# Patient Record
Sex: Male | Born: 1975 | Race: Black or African American | Hispanic: No | Marital: Married | Smoking: Current every day smoker
Health system: Southern US, Community
[De-identification: ages and names within clinical notes are randomized; demographics above are authoritative.]

## PROBLEM LIST (undated history)

## (undated) DIAGNOSIS — I10 Essential (primary) hypertension: Secondary | ICD-10-CM

## (undated) DIAGNOSIS — E1140 Type 2 diabetes mellitus with diabetic neuropathy, unspecified: Secondary | ICD-10-CM

## (undated) HISTORY — PX: WISDOM TOOTH EXTRACTION: SHX21

---

## 2001-11-19 ENCOUNTER — Emergency Department (HOSPITAL_COMMUNITY): Admission: EM | Admit: 2001-11-19 | Discharge: 2001-11-20 | Payer: Self-pay | Admitting: Emergency Medicine

## 2001-11-20 ENCOUNTER — Encounter: Payer: Self-pay | Admitting: Emergency Medicine

## 2003-03-24 ENCOUNTER — Encounter: Admission: RE | Admit: 2003-03-24 | Discharge: 2003-06-22 | Payer: Self-pay | Admitting: Internal Medicine

## 2003-03-27 ENCOUNTER — Emergency Department (HOSPITAL_COMMUNITY): Admission: AD | Admit: 2003-03-27 | Discharge: 2003-03-27 | Payer: Self-pay | Admitting: Family Medicine

## 2003-06-21 ENCOUNTER — Emergency Department (HOSPITAL_COMMUNITY): Admission: AD | Admit: 2003-06-21 | Discharge: 2003-06-21 | Payer: Self-pay | Admitting: Family Medicine

## 2003-11-04 ENCOUNTER — Emergency Department (HOSPITAL_COMMUNITY): Admission: EM | Admit: 2003-11-04 | Discharge: 2003-11-04 | Payer: Self-pay | Admitting: Family Medicine

## 2004-04-13 ENCOUNTER — Emergency Department (HOSPITAL_COMMUNITY): Admission: EM | Admit: 2004-04-13 | Discharge: 2004-04-14 | Payer: Self-pay | Admitting: Emergency Medicine

## 2004-05-10 ENCOUNTER — Emergency Department (HOSPITAL_COMMUNITY): Admission: EM | Admit: 2004-05-10 | Discharge: 2004-05-10 | Payer: Self-pay | Admitting: Family Medicine

## 2004-05-11 ENCOUNTER — Emergency Department (HOSPITAL_COMMUNITY): Admission: EM | Admit: 2004-05-11 | Discharge: 2004-05-11 | Payer: Self-pay | Admitting: Family Medicine

## 2004-05-13 ENCOUNTER — Emergency Department (HOSPITAL_COMMUNITY): Admission: EM | Admit: 2004-05-13 | Discharge: 2004-05-14 | Payer: Self-pay | Admitting: Emergency Medicine

## 2004-10-12 ENCOUNTER — Emergency Department (HOSPITAL_COMMUNITY): Admission: EM | Admit: 2004-10-12 | Discharge: 2004-10-12 | Payer: Self-pay | Admitting: Family Medicine

## 2005-01-11 ENCOUNTER — Emergency Department (INDEPENDENT_AMBULATORY_CARE_PROVIDER_SITE_OTHER)
Admission: EM | Admit: 2005-01-11 | Discharge: 2005-01-11 | Payer: No Typology Code available for payment source | Admitting: Emergency Medicine

## 2005-05-29 ENCOUNTER — Emergency Department (HOSPITAL_COMMUNITY): Admission: EM | Admit: 2005-05-29 | Discharge: 2005-05-29 | Payer: Self-pay | Admitting: Emergency Medicine

## 2005-07-04 ENCOUNTER — Emergency Department (HOSPITAL_COMMUNITY): Admission: EM | Admit: 2005-07-04 | Discharge: 2005-07-04 | Payer: Self-pay | Admitting: Emergency Medicine

## 2005-09-01 ENCOUNTER — Inpatient Hospital Stay (HOSPITAL_COMMUNITY): Admission: AD | Admit: 2005-09-01 | Discharge: 2005-09-02 | Payer: Self-pay | Admitting: Family Medicine

## 2006-10-05 ENCOUNTER — Ambulatory Visit: Payer: Self-pay | Admitting: Internal Medicine

## 2006-10-06 ENCOUNTER — Ambulatory Visit: Payer: Self-pay | Admitting: *Deleted

## 2006-10-20 ENCOUNTER — Ambulatory Visit: Payer: Self-pay | Admitting: Internal Medicine

## 2007-02-02 ENCOUNTER — Ambulatory Visit: Payer: Self-pay | Admitting: Internal Medicine

## 2007-05-09 ENCOUNTER — Ambulatory Visit: Payer: Self-pay | Admitting: Internal Medicine

## 2007-06-13 ENCOUNTER — Ambulatory Visit: Payer: Self-pay | Admitting: Internal Medicine

## 2007-06-13 LAB — CONVERTED CEMR LAB
ALT: 21 units/L (ref 0–53)
AST: 14 units/L (ref 0–37)
Albumin: 4.4 g/dL (ref 3.5–5.2)
Alkaline Phosphatase: 88 units/L (ref 39–117)
BUN: 16 mg/dL (ref 6–23)
CO2: 25 meq/L (ref 19–32)
Calcium: 9.8 mg/dL (ref 8.4–10.5)
Chlamydia, Swab/Urine, PCR: NEGATIVE
Chloride: 101 meq/L (ref 96–112)
Cholesterol: 244 mg/dL — ABNORMAL HIGH (ref 0–200)
Creatinine, Ser: 1.32 mg/dL (ref 0.40–1.50)
GC Probe Amp, Urine: NEGATIVE
Glucose, Bld: 161 mg/dL — ABNORMAL HIGH (ref 70–99)
HDL: 46 mg/dL (ref >39)
LDL Cholesterol: 169 mg/dL — ABNORMAL HIGH (ref 0–99)
Potassium: 3.8 meq/L (ref 3.5–5.3)
Sodium: 138 meq/L (ref 135–145)
TSH: 1.357 microintl units/mL (ref 0.350–5.50)
Total Bilirubin: 0.4 mg/dL (ref 0.3–1.2)
Total CHOL/HDL Ratio: 5.3
Total Protein: 7.9 g/dL (ref 6.0–8.3)
Triglycerides: 147 mg/dL (ref <150)
VLDL: 29 mg/dL (ref 0–40)

## 2007-07-16 ENCOUNTER — Ambulatory Visit: Payer: Self-pay | Admitting: Internal Medicine

## 2007-09-03 ENCOUNTER — Ambulatory Visit: Payer: Self-pay | Admitting: Internal Medicine

## 2007-09-03 LAB — CONVERTED CEMR LAB
ALT: 19 units/L (ref 0–53)
AST: 11 units/L (ref 0–37)
Albumin: 4.5 g/dL (ref 3.5–5.2)
Alkaline Phosphatase: 81 units/L (ref 39–117)
BUN: 16 mg/dL (ref 6–23)
CO2: 21 meq/L (ref 19–32)
Calcium: 9.9 mg/dL (ref 8.4–10.5)
Chloride: 106 meq/L (ref 96–112)
Cholesterol: 152 mg/dL (ref 0–200)
Creatinine, Ser: 1.18 mg/dL (ref 0.40–1.50)
Glucose, Bld: 149 mg/dL — ABNORMAL HIGH (ref 70–99)
HDL: 41 mg/dL (ref >39)
LDL Cholesterol: 92 mg/dL (ref 0–99)
Potassium: 3.8 meq/L (ref 3.5–5.3)
Sodium: 139 meq/L (ref 135–145)
Total Bilirubin: 0.3 mg/dL (ref 0.3–1.2)
Total CHOL/HDL Ratio: 3.7
Total Protein: 7.5 g/dL (ref 6.0–8.3)
Triglycerides: 96 mg/dL (ref <150)
VLDL: 19 mg/dL (ref 0–40)

## 2008-03-03 ENCOUNTER — Ambulatory Visit: Payer: Self-pay | Admitting: Internal Medicine

## 2008-09-22 ENCOUNTER — Ambulatory Visit: Payer: Self-pay | Admitting: Family Medicine

## 2008-09-22 ENCOUNTER — Encounter: Payer: Self-pay | Admitting: Family Medicine

## 2008-09-22 LAB — CONVERTED CEMR LAB
ALT: 47 units/L (ref 0–53)
AST: 24 units/L (ref 0–37)
Albumin: 4.2 g/dL (ref 3.5–5.2)
Alkaline Phosphatase: 98 units/L (ref 39–117)
BUN: 13 mg/dL (ref 6–23)
CO2: 25 meq/L (ref 19–32)
Calcium: 9.1 mg/dL (ref 8.4–10.5)
Chloride: 107 meq/L (ref 96–112)
Cholesterol: 208 mg/dL — ABNORMAL HIGH (ref 0–200)
Creatinine, Ser: 1.07 mg/dL (ref 0.40–1.50)
Glucose, Bld: 218 mg/dL — ABNORMAL HIGH (ref 70–99)
HDL: 45 mg/dL (ref >39)
LDL Cholesterol: 142 mg/dL — ABNORMAL HIGH (ref 0–99)
Microalb, Ur: 1.44 mg/dL (ref 0.00–1.89)
Potassium: 3.8 meq/L (ref 3.5–5.3)
Sodium: 140 meq/L (ref 135–145)
Total Bilirubin: 0.4 mg/dL (ref 0.3–1.2)
Total CHOL/HDL Ratio: 4.6
Total Protein: 7.3 g/dL (ref 6.0–8.3)
Triglycerides: 104 mg/dL (ref <150)
VLDL: 21 mg/dL (ref 0–40)

## 2008-10-02 ENCOUNTER — Ambulatory Visit: Payer: Self-pay | Admitting: Family Medicine

## 2009-01-12 ENCOUNTER — Ambulatory Visit: Payer: Self-pay | Admitting: Internal Medicine

## 2009-08-21 ENCOUNTER — Ambulatory Visit: Payer: Self-pay | Admitting: Internal Medicine

## 2010-02-25 ENCOUNTER — Ambulatory Visit: Payer: Self-pay | Admitting: Internal Medicine

## 2010-02-25 LAB — CONVERTED CEMR LAB: Microalb, Ur: 0.5 mg/dL (ref 0.00–1.89)

## 2010-06-22 ENCOUNTER — Emergency Department (HOSPITAL_COMMUNITY)
Admission: EM | Admit: 2010-06-22 | Discharge: 2010-06-23 | Payer: Self-pay | Source: Home / Self Care | Admitting: Emergency Medicine

## 2010-06-23 LAB — COMPREHENSIVE METABOLIC PANEL
ALT: 33 U/L (ref 0–53)
AST: 24 U/L (ref 0–37)
Albumin: 3.9 g/dL (ref 3.5–5.2)
Alkaline Phosphatase: 106 U/L (ref 39–117)
BUN: 13 mg/dL (ref 6–23)
CO2: 27 mEq/L (ref 19–32)
Calcium: 10 mg/dL (ref 8.4–10.5)
Chloride: 101 mEq/L (ref 96–112)
Creatinine, Ser: 1.78 mg/dL — ABNORMAL HIGH (ref 0.4–1.5)
GFR calc Af Amer: 53 mL/min — ABNORMAL LOW (ref >60)
GFR calc non Af Amer: 44 mL/min — ABNORMAL LOW (ref >60)
Glucose, Bld: 244 mg/dL — ABNORMAL HIGH (ref 70–99)
Potassium: 3.8 mEq/L (ref 3.5–5.1)
Sodium: 138 mEq/L (ref 135–145)
Total Bilirubin: 0.3 mg/dL (ref 0.3–1.2)
Total Protein: 7.9 g/dL (ref 6.0–8.3)

## 2010-06-23 LAB — CBC
HCT: 46.5 % (ref 39.0–52.0)
Hemoglobin: 15.5 g/dL (ref 13.0–17.0)
MCH: 29.3 pg (ref 26.0–34.0)
MCHC: 33.3 g/dL (ref 30.0–36.0)
MCV: 87.9 fL (ref 78.0–100.0)
Platelets: 286 10*3/uL (ref 150–400)
RBC: 5.29 MIL/uL (ref 4.22–5.81)
RDW: 12.8 % (ref 11.5–15.5)
WBC: 10.6 10*3/uL — ABNORMAL HIGH (ref 4.0–10.5)

## 2010-06-23 LAB — DIFFERENTIAL
Basophils Absolute: 0.1 10*3/uL (ref 0.0–0.1)
Basophils Relative: 1 % (ref 0–1)
Eosinophils Absolute: 0.4 10*3/uL (ref 0.0–0.7)
Eosinophils Relative: 4 % (ref 0–5)
Lymphocytes Relative: 35 % (ref 12–46)
Lymphs Abs: 3.7 10*3/uL (ref 0.7–4.0)
Monocytes Absolute: 0.8 10*3/uL (ref 0.1–1.0)
Monocytes Relative: 8 % (ref 3–12)
Neutro Abs: 5.6 10*3/uL (ref 1.7–7.7)
Neutrophils Relative %: 52 % (ref 43–77)

## 2010-06-23 LAB — URINALYSIS, ROUTINE W REFLEX MICROSCOPIC
Bilirubin Urine: NEGATIVE
Hemoglobin, Urine: NEGATIVE
Leukocytes, UA: NEGATIVE
Nitrite: NEGATIVE
Protein, ur: NEGATIVE mg/dL
Specific Gravity, Urine: 1.031 — ABNORMAL HIGH (ref 1.005–1.030)
Urine Glucose, Fasting: >1000 mg/dL — AB
Urobilinogen, UA: 0.2 mg/dL (ref 0.0–1.0)
pH: 5.5 (ref 5.0–8.0)

## 2010-06-23 LAB — URINE MICROSCOPIC-ADD ON

## 2010-06-23 LAB — GLUCOSE, CAPILLARY: Glucose-Capillary: 240 mg/dL — ABNORMAL HIGH (ref 70–99)

## 2010-10-30 ENCOUNTER — Inpatient Hospital Stay (INDEPENDENT_AMBULATORY_CARE_PROVIDER_SITE_OTHER)
Admission: RE | Admit: 2010-10-30 | Discharge: 2010-10-30 | Disposition: A | Discharge status: Home / Self Care | Payer: Self-pay | Source: Ambulatory Visit | Attending: Emergency Medicine | Admitting: Emergency Medicine

## 2010-10-30 DIAGNOSIS — R7989 Other specified abnormal findings of blood chemistry: Secondary | ICD-10-CM

## 2010-10-30 DIAGNOSIS — K224 Dyskinesia of esophagus: Secondary | ICD-10-CM

## 2010-10-30 LAB — POCT I-STAT, CHEM 8
BUN: 14 mg/dL (ref 6–23)
Calcium, Ion: 1.25 mmol/L (ref 1.12–1.32)
Chloride: 101 mEq/L (ref 96–112)
Creatinine, Ser: 1.2 mg/dL (ref 0.4–1.5)
Glucose, Bld: 288 mg/dL — ABNORMAL HIGH (ref 70–99)
HCT: 47 % (ref 39.0–52.0)
Hemoglobin: 16 g/dL (ref 13.0–17.0)
Potassium: 3.6 mEq/L (ref 3.5–5.1)
Sodium: 137 mEq/L (ref 135–145)
TCO2: 27 mmol/L (ref 0–100)

## 2012-03-02 ENCOUNTER — Encounter (HOSPITAL_COMMUNITY): Payer: Self-pay | Admitting: *Deleted

## 2012-03-02 ENCOUNTER — Emergency Department (INDEPENDENT_AMBULATORY_CARE_PROVIDER_SITE_OTHER)
Admission: EM | Admit: 2012-03-02 | Discharge: 2012-03-02 | Disposition: A | Discharge status: Home / Self Care | Payer: Self-pay | Source: Home / Self Care | Attending: Emergency Medicine | Admitting: Emergency Medicine

## 2012-03-02 DIAGNOSIS — IMO0001 Reserved for inherently not codable concepts without codable children: Secondary | ICD-10-CM

## 2012-03-02 DIAGNOSIS — M791 Myalgia, unspecified site: Secondary | ICD-10-CM

## 2012-03-02 DIAGNOSIS — M609 Myositis, unspecified: Secondary | ICD-10-CM

## 2012-03-02 HISTORY — DX: Essential (primary) hypertension: I10

## 2012-03-02 MED ORDER — TRAMADOL HCL 50 MG PO TABS: 50.0000 mg | ORAL_TABLET | Freq: Four times a day (QID) | ORAL | Status: DC | PRN

## 2012-03-02 MED ORDER — KETOROLAC TROMETHAMINE 60 MG/2ML IM SOLN
60.0000 mg | Freq: Once | INTRAMUSCULAR | Status: AC
  Administered 2012-03-02: 60 mg via INTRAMUSCULAR

## 2012-03-02 MED ORDER — NAPROXEN 500 MG PO TABS: 500.0000 mg | ORAL_TABLET | Freq: Two times a day (BID) | ORAL | Status: DC

## 2012-03-02 MED ORDER — KETOROLAC TROMETHAMINE 60 MG/2ML IM SOLN
INTRAMUSCULAR | Status: AC
  Filled 2012-03-02: qty 2

## 2012-03-02 NOTE — ED Provider Notes (Signed)
History     CSN: [id number]  Arrival date & time 03/02/12  1039   First MD Initiated Contact with Patient 03/02/12 1042      Chief Complaint  Patient presents with  . Extremity Weakness    (Consider location/radiation/quality/duration/timing/severity/associated sxs/prior treatment) HPI Comments: C/O Right shoulder pain for 1 month. Located in anterior shoulder as well as the trapezius, deltoid and forearm musculature. He works as a Mudlogger. This makes the sx's worse. Denies any trauma. Or known single event.   The history is provided by the patient.    Past Medical History  Diagnosis Date  . Diabetes mellitus   . Hypertension     History reviewed. No pertinent past surgical history.  Family History  Problem Relation Age of Onset  . Family history unknown: Yes    History  Substance Use Topics  . Smoking status: Current Every Day Smoker -- 1.0 packs/day    Types: Cigarettes  . Smokeless tobacco: Not on file  . Alcohol Use: Yes     occasional      Review of Systems  Constitutional: Positive for activity change. Negative for fever.  HENT: Negative for congestion, neck pain and neck stiffness.   Respiratory: Negative.   Cardiovascular: Negative.   Gastrointestinal: Negative.   Musculoskeletal: Negative for back pain and joint swelling.  Skin: Negative.     Allergies  Review of patient's allergies indicates no known allergies.  Home Medications   Current Outpatient Rx  Name Route Sig Dispense Refill  . AMLODIPINE BESYLATE 10 MG PO TABS Oral Take by mouth daily.    . ASPIRIN 81 MG PO TABS Oral Take 81 mg by mouth daily.    . INSULIN ASPART 100 UNIT/ML [place] SOLN Subcutaneous Inject into the skin 3 (three) times daily before meals.    . INSULIN GLARGINE 100 UNIT/ML [place] SOLN Subcutaneous Inject into the skin at bedtime.    Marland Kitchen LISINOPRIL 10 MG PO TABS Oral Take by mouth daily.    Marland Kitchen NAPROXEN 500 MG PO TABS Oral Take 1 tablet (500 mg total) by mouth 2  (two) times daily. Take with food 30 tablet 0  . TRAMADOL HCL 50 MG PO TABS Oral Take 1 tablet (50 mg total) by mouth every 6 (six) hours as needed for pain. 20 tablet 0    BP 123/86  Pulse 90  Temp 98.4 F (36.9 C) (Oral)  Resp 18  SpO2 97%  Physical Exam  Constitutional: He is oriented to person, place, and time. He appears well-developed and well-nourished. No distress.  Neck: Normal range of motion. Neck supple.  Pulmonary/Chest: Effort normal. No respiratory distress.  Musculoskeletal: He exhibits tenderness. He exhibits no edema.       Tenderness in R Trapezieus , anterior chest, deltoid and forearm musculature. Abduction to 90 deg. Internal and external rotation produces pain in above muscles. No direct joint tenderness. No redness or swelling. Distal N/S intact. Pules 2+. Muscle tone is nl   Neurological: He is alert and oriented to person, place, and time. No cranial nerve deficit.  Skin: Skin is warm and dry.  Psychiatric: He has a normal mood and affect.    ED Course  Procedures (including critical care time)  Labs Reviewed - No data to display No results found.   1. Myofasciitis   2. Myalgia       MDM  Heat, heating pad and Thermawraps Toradol 60mg  IM now Naprosyn 500 bid with food Ultram 50mg  q 4-6h  prn pain Limit use for a few days OOW for 3 d.  Wear the Sling for a few days.        Hayden Rasmussen, NP 03/02/12 1205

## 2012-03-02 NOTE — ED Notes (Signed)
Pt reports right arm pain and weakness for the past month with no known injury

## 2012-03-03 NOTE — ED Provider Notes (Signed)
Medical screening examination/treatment/procedure(s) were performed by non-physician practitioner and as supervising physician I was immediately available for consultation/collaboration.  Sherby Moncayo   Munachimso Palin, MD 03/03/12 1144 

## 2012-06-19 ENCOUNTER — Other Ambulatory Visit (HOSPITAL_COMMUNITY): Payer: Self-pay | Admitting: Internal Medicine

## 2012-06-19 ENCOUNTER — Emergency Department (HOSPITAL_COMMUNITY)
Admission: EM | Admit: 2012-06-19 | Discharge: 2012-06-19 | Disposition: A | Discharge status: Home / Self Care | Payer: No Typology Code available for payment source | Source: Home / Self Care

## 2012-06-19 ENCOUNTER — Encounter (HOSPITAL_COMMUNITY): Payer: Self-pay

## 2012-06-19 DIAGNOSIS — I10 Essential (primary) hypertension: Secondary | ICD-10-CM | POA: Diagnosis present

## 2012-06-19 DIAGNOSIS — G629 Polyneuropathy, unspecified: Secondary | ICD-10-CM | POA: Diagnosis present

## 2012-06-19 DIAGNOSIS — IMO0001 Reserved for inherently not codable concepts without codable children: Secondary | ICD-10-CM

## 2012-06-19 DIAGNOSIS — E119 Type 2 diabetes mellitus without complications: Secondary | ICD-10-CM | POA: Diagnosis present

## 2012-06-19 LAB — BASIC METABOLIC PANEL
BUN: 15 mg/dL (ref 6–23)
BUN: 15 mg/dL (ref 6–23)
CO2: 28 mEq/L (ref 19–32)
Calcium: 10.2 mg/dL (ref 8.4–10.5)
Chloride: 98 mEq/L (ref 96–112)
Creatinine, Ser: 1.02 mg/dL (ref 0.50–1.35)
GFR calc Af Amer: >90 mL/min (ref >90)
GFR calc Af Amer: >90 mL/min (ref >90)
GFR calc non Af Amer: >90 mL/min (ref >90)
GFR calc non Af Amer: >90 mL/min (ref >90)
Glucose, Bld: 306 mg/dL — ABNORMAL HIGH (ref 70–99)
Potassium: 3.9 mEq/L (ref 3.5–5.1)
Potassium: 4.1 mEq/L (ref 3.5–5.1)
Sodium: 137 mEq/L (ref 135–145)
Sodium: 139 mEq/L (ref 135–145)

## 2012-06-19 LAB — LIPID PANEL
HDL: 52 mg/dL (ref >39)
Triglycerides: 234 mg/dL — ABNORMAL HIGH (ref <150)

## 2012-06-19 LAB — HEMOGLOBIN A1C
Hgb A1c MFr Bld: 13.2 % — ABNORMAL HIGH (ref <5.7)
Hgb A1c MFr Bld: 13.5 % — ABNORMAL HIGH (ref <5.7)
Mean Plasma Glucose: 341 mg/dL — ABNORMAL HIGH (ref <117)

## 2012-06-19 LAB — GLUCOSE, CAPILLARY: Glucose-Capillary: 289 mg/dL — ABNORMAL HIGH (ref 70–99)

## 2012-06-19 MED ORDER — AMLODIPINE BESYLATE 10 MG PO TABS: 10.0000 mg | ORAL_TABLET | Freq: Every day | ORAL | Status: DC

## 2012-06-19 MED ORDER — INSULIN GLARGINE 100 UNIT/ML ~~LOC~~ SOLN: 30.0000 [IU] | Freq: Every day | SUBCUTANEOUS | Status: DC

## 2012-06-19 MED ORDER — INSULIN ASPART 100 UNIT/ML ~~LOC~~ SOLN: SUBCUTANEOUS | Status: DC

## 2012-06-19 MED ORDER — ASPIRIN 81 MG PO TABS: 81.0000 mg | ORAL_TABLET | Freq: Every day | ORAL | Status: DC

## 2012-06-19 MED ORDER — TRAMADOL HCL 50 MG PO TABS: 50.0000 mg | ORAL_TABLET | Freq: Two times a day (BID) | ORAL | Status: DC

## 2012-06-19 MED ORDER — LANCETS MISC: Status: AC

## 2012-06-19 MED ORDER — INSULIN PEN NEEDLE 31G X 8 MM MISC: Status: DC

## 2012-06-19 MED ORDER — LISINOPRIL 10 MG PO TABS: 10.0000 mg | ORAL_TABLET | Freq: Every day | ORAL | Status: DC

## 2012-06-19 NOTE — ED Notes (Signed)
Former [facility] client in need of medication refill 

## 2012-06-20 NOTE — ED Provider Notes (Signed)
History    CSN: [id number]  Arrival date & time 06/19/12  1026   None     Chief Complaint  Patient presents with  . Medication Refill    (Consider location/radiation/quality/duration/timing/severity/associated sxs/prior treatment) HPI Comments: 37 yo with poorly controlled insulin dependent diabetes, HTN, and peripheral neuropathy in for follow-up and medication refills. He has been out of his insulin for several weeks-using his friends insulin to get by. He was a previous [facility] patient. He complains of right arm pain, mostly musclular but has some baseline burning pain in his hands and feet. He reports knowing that his blood sugars have been high with increased urination and thirst.    The history is provided by the patient.    Past Medical History  Diagnosis Date  . Diabetes mellitus   . Hypertension     History reviewed. No pertinent past surgical history.  No family history on file.  History  Substance Use Topics  . Smoking status: Current Every Day Smoker -- 1.0 packs/day    Types: Cigarettes  . Smokeless tobacco: Not on file  . Alcohol Use: Yes     Comment: occasional      Review of Systems  Constitutional: Positive for activity change and fatigue.  Eyes: Negative.   Respiratory: Negative.   Cardiovascular: Negative.   Gastrointestinal: Negative.   Genitourinary: Positive for urgency and frequency.  Musculoskeletal: Positive for myalgias and arthralgias.  Neurological: Positive for headaches.  Hematological: Negative.   Psychiatric/Behavioral: Negative.     Allergies  Review of patient's allergies indicates no known allergies.  Home Medications   Current Outpatient Rx  Name  Route  Sig  Dispense  Refill  . AMLODIPINE BESYLATE 10 MG PO TABS   Oral   Take 1 tablet (10 mg total) by mouth daily.   90 tablet   3   . ASPIRIN 81 MG PO TABS   Oral   Take 1 tablet (81 mg total) by mouth daily.   90 tablet   3   . INSULIN ASPART 100 UNIT/ML  [place] SOLN      As directed for meal coverage based on sliding scale.   1 pen   12   . INSULIN ASPART 100 UNIT/ML [place] SOLN      Inject 3-4 units with meals and as directed for CBG coverage   1 pen   12   . INSULIN GLARGINE 100 UNIT/ML [place] SOLN   Subcutaneous   Inject 30 Units into the skin at bedtime.   1 pen   12   . INSULIN GLARGINE 100 UNIT/ML [place] SOLN   Subcutaneous   Inject 30 Units into the skin at bedtime.   1 pen   12     This is the [employer]   . INSULIN PEN NEEDLE 31G X 8 MM MISC      Use new pen needle with each injection-Lantus and Novolog pen.   100 each   11   . LANCETS MISC      For TID CBG testing   200 each   11   . LISINOPRIL 10 MG PO TABS   Oral   Take 1 tablet (10 mg total) by mouth daily.   90 tablet   3   . TRAMADOL HCL 50 MG PO TABS   Oral   Take 1 tablet (50 mg total) by mouth every 6 (six) hours as needed for pain.   20 tablet   0   .  TRAMADOL HCL 50 MG PO TABS   Oral   Take 1 tablet (50 mg total) by mouth every 12 (twelve) hours.   60 tablet   3     BP 120/78  Pulse 87  Temp 97.7 F (36.5 C) (Oral)  Resp 19  SpO2 100%  Physical Exam  Constitutional: He appears well-developed and well-nourished.  HENT:  Head: Normocephalic and atraumatic.  Mouth/Throat: Oropharynx is clear and moist.  Eyes: Pupils are equal, round, and reactive to light.  Neck: Normal range of motion. Neck supple.  Cardiovascular: Normal rate, regular rhythm and normal heart sounds.   Pulmonary/Chest: Effort normal and breath sounds normal.  Abdominal: Soft. Bowel sounds are normal.  Musculoskeletal:       muscle pain on palpation in right trapezius, deltoid and shoulder, joint not involved, NV intact, strength normal    ED Course  Procedures (including critical care time)  Labs Reviewed  BASIC METABOLIC PANEL - Abnormal; Notable for the following:    Glucose, Bld 307 (*)     All other components within normal limits  LIPID PANEL -  Abnormal; Notable for the following:    Cholesterol 268 (*)     Triglycerides 234 (*)     VLDL 47 (*)     LDL Cholesterol 169 (*)     All other components within normal limits  HEMOGLOBIN A1C - Abnormal; Notable for the following:    Hemoglobin A1C 13.2 (*)     Mean Plasma Glucose 332 (*)     All other components within normal limits  GLUCOSE, CAPILLARY - Abnormal; Notable for the following:    Glucose-Capillary 289 (*)     All other components within normal limits  LAB REPORT - SCANNED   No results found.   No diagnosis found.    MDM  1. DM: Lantus 30 units qhs-solostar pen, will take scripts to health department for fill Novolog Flex pen, Sliding scale as directed with meal coverage TID and at HS-patient can explain and repeat back how to dose his insulin Needs referral to Diabetes educator, will attempt to access this resource Added ASA 81 daily Check A1C today BMET to check renal function Urine to check for protien  2. HTN: Refilled Norvasc and Lisinopril  3. Peripheral Neuropathy and Myalgias Tramadol for pain prn  4. Preventive Care: Foot exam at next visit Will check lipids today.  F/U in 2 weeks.      Edsel Petrin, DO 06/21/12 0105

## 2012-09-23 NOTE — Progress Notes (Deleted)
Unable to close note

## 2012-12-28 ENCOUNTER — Ambulatory Visit: Payer: Self-pay | Attending: Family Medicine

## 2013-01-03 ENCOUNTER — Ambulatory Visit: Payer: Self-pay

## 2013-01-07 ENCOUNTER — Ambulatory Visit: Payer: No Typology Code available for payment source | Attending: Family Medicine | Admitting: Internal Medicine

## 2013-01-07 VITALS — BP 143/91 | HR 81 | Temp 98.5°F | Resp 18 | Ht 69.0 in | Wt 243.0 lb

## 2013-01-07 DIAGNOSIS — Z9119 Patient's noncompliance with other medical treatment and regimen: Secondary | ICD-10-CM | POA: Insufficient documentation

## 2013-01-07 DIAGNOSIS — I10 Essential (primary) hypertension: Secondary | ICD-10-CM | POA: Insufficient documentation

## 2013-01-07 DIAGNOSIS — G609 Hereditary and idiopathic neuropathy, unspecified: Secondary | ICD-10-CM | POA: Insufficient documentation

## 2013-01-07 DIAGNOSIS — F17200 Nicotine dependence, unspecified, uncomplicated: Secondary | ICD-10-CM | POA: Insufficient documentation

## 2013-01-07 DIAGNOSIS — Z7982 Long term (current) use of aspirin: Secondary | ICD-10-CM | POA: Insufficient documentation

## 2013-01-07 DIAGNOSIS — Z91199 Patient's noncompliance with other medical treatment and regimen due to unspecified reason: Secondary | ICD-10-CM | POA: Insufficient documentation

## 2013-01-07 DIAGNOSIS — E119 Type 2 diabetes mellitus without complications: Secondary | ICD-10-CM | POA: Insufficient documentation

## 2013-01-07 DIAGNOSIS — Z794 Long term (current) use of insulin: Secondary | ICD-10-CM | POA: Insufficient documentation

## 2013-01-07 LAB — CBC WITH DIFFERENTIAL/PLATELET
Basophils Absolute: 0 10*3/uL (ref 0.0–0.1)
Basophils Relative: 0 % (ref 0–1)
Eosinophils Absolute: 0.3 10*3/uL (ref 0.0–0.7)
Eosinophils Relative: 3 % (ref 0–5)
HCT: 46.9 % (ref 39.0–52.0)
MCH: 28.8 pg (ref 26.0–34.0)
MCHC: 33.9 g/dL (ref 30.0–36.0)
MCV: 85 fL (ref 78.0–100.0)
Monocytes Absolute: 0.5 10*3/uL (ref 0.1–1.0)
Monocytes Relative: 7 % (ref 3–12)
RDW: 13.3 % (ref 11.5–15.5)

## 2013-01-07 MED ORDER — INSULIN GLARGINE 100 UNIT/ML ~~LOC~~ SOLN: 30.0000 [IU] | Freq: Every day | SUBCUTANEOUS | Status: DC

## 2013-01-07 MED ORDER — AMLODIPINE BESYLATE 10 MG PO TABS: 10.0000 mg | ORAL_TABLET | Freq: Every day | ORAL | Status: DC

## 2013-01-07 MED ORDER — INSULIN ASPART 100 UNIT/ML ~~LOC~~ SOLN: SUBCUTANEOUS | Status: DC

## 2013-01-07 NOTE — Progress Notes (Signed)
Patient states he needs refills on all his medications; has been out of hypertensive medication for a few days now.

## 2013-01-07 NOTE — Progress Notes (Signed)
Patient ID: Larry Mercer, male   DOB: [birth year], 37 y.o.   MRN: [id number]  CC:  HPI: 37 year old male who presents for a followup, he has a history of diabetes and hypertension has been extremely noncompliant with his insulin use and has been out of insulin for about 3 months. The patient is complaining of numbness and tingling in his feet he denies any polyuria polydipsia denies any headache.   No Known Allergies Past Medical History  Diagnosis Date  . Diabetes mellitus   . Hypertension    Current Outpatient Prescriptions on File Prior to Visit  Medication Sig Dispense Refill  . aspirin 81 MG tablet Take 1 tablet (81 mg total) by mouth daily.  90 tablet  3  . insulin aspart (NOVOLOG FLEXPEN) 100 UNIT/ML injection Inject 3-4 units with meals and as directed for CBG coverage  1 pen  12  . insulin glargine (LANTUS) 100 UNIT/ML injection Inject 30 Units into the skin at bedtime.  1 pen  12  . Insulin Pen Needle (1ST CHOICE PEN NEEDLES) 31G X 8 MM MISC Use new pen needle with each injection-Lantus and Novolog pen.  100 each  11  . Lancets MISC For TID CBG testing  200 each  11  . lisinopril (PRINIVIL,ZESTRIL) 10 MG tablet Take 1 tablet (10 mg total) by mouth daily.  90 tablet  3  . traMADol (ULTRAM) 50 MG tablet Take 1 tablet (50 mg total) by mouth every 6 (six) hours as needed for pain.  20 tablet  0  . traMADol (ULTRAM) 50 MG tablet Take 1 tablet (50 mg total) by mouth every 12 (twelve) hours.  60 tablet  3   No current facility-administered medications on file prior to visit.   History reviewed. No pertinent family history. History   Social History  . Marital Status: Married    Spouse Name: N/A    Number of Children: N/A  . Years of Education: N/A   Occupational History  . Not on file.   Social History Main Topics  . Smoking status: Current Every Day Smoker -- 1.00 packs/day    Types: Cigarettes  . Smokeless tobacco: Not on file  . Alcohol Use: Yes     Comment: occasional   . Drug Use: 21.00 per week    Special: Marijuana  . Sexually Active: No   Other Topics Concern  . Not on file   Social History Narrative  . No narrative on file    Review of Systems  Constitutional: Negative for fever, chills, diaphoresis, activity change, appetite change and fatigue.  HENT: Negative for ear pain, nosebleeds, congestion, facial swelling, rhinorrhea, neck pain, neck stiffness and ear discharge.   Eyes: Negative for pain, discharge, redness, itching and visual disturbance.  Respiratory: Negative for cough, choking, chest tightness, shortness of breath, wheezing and stridor.   Cardiovascular: Negative for chest pain, palpitations and leg swelling.  Gastrointestinal: Negative for abdominal distention.  Genitourinary: Negative for dysuria, urgency, frequency, hematuria, flank pain, decreased urine volume, difficulty urinating and dyspareunia.  Musculoskeletal: Negative for back pain, joint swelling, arthralgias and gait problem.  Neurological: Negative for dizziness, tremors, seizures, syncope, facial asymmetry, speech difficulty, weakness, light-headedness, numbness and headaches.  Hematological: Negative for adenopathy. Does not bruise/bleed easily.  Psychiatric/Behavioral: Negative for hallucinations, behavioral problems, confusion, dysphoric mood, decreased concentration and agitation.    Objective:   Filed Vitals:   01/07/13 1517  BP: 143/91  Pulse: 81  Temp: 98.5 F (36.9 C)  Resp: 18  Physical Exam  Constitutional: Appears well-developed and well-nourished. No distress.  HENT: Normocephalic. External right and left ear normal. Oropharynx is clear and moist.  Eyes: Conjunctivae and EOM are normal. PERRLA, no scleral icterus.  Neck: Normal ROM. Neck supple. No JVD. No tracheal deviation. No thyromegaly.  CVS: RRR, S1/S2 +, no murmurs, no gallops, no carotid bruit.  Pulmonary: Effort and breath sounds normal, no stridor, rhonchi, wheezes, rales.   Abdominal: Soft. BS +,  no distension, tenderness, rebound or guarding.  Musculoskeletal: Normal range of motion. No edema and no tenderness.  Lymphadenopathy: No lymphadenopathy noted, cervical, inguinal. Neuro: Alert. Normal reflexes, muscle tone coordination. No cranial nerve deficit. Skin: Skin is warm and dry. No rash noted. Not diaphoretic. No erythema. No pallor.  Psychiatric: Normal mood and affect. Behavior, judgment, thought content normal.   Lab Results  Component Value Date   WBC 10.6* 06/23/2010   HGB 16.0 10/30/2010   HCT 47.0 10/30/2010   MCV 87.9 06/23/2010   PLT 286 06/23/2010   Lab Results  Component Value Date   CREATININE 1.02 06/19/2012   CREATININE 1.02 06/19/2012   BUN 15 06/19/2012   BUN 15 06/19/2012   NA 137 06/19/2012   NA 139 06/19/2012   K 4.1 06/19/2012   K 3.9 06/19/2012   CL 98 06/19/2012   CL 99 06/19/2012   CO2 28 06/19/2012   CO2 28 06/19/2012    Lab Results  Component Value Date   HGBA1C 13.5* 06/19/2012   HGBA1C 13.2* 06/19/2012   Lipid Panel     Component Value Date/Time   CHOL 268* 06/19/2012 1220   TRIG 234* 06/19/2012 1220   HDL 52 06/19/2012 1220   CHOLHDL 5.2 06/19/2012 1220   VLDL 47* 06/19/2012 1220   LDLCALC 169* 06/19/2012 1220       Assessment and plan:   Patient Active Problem List   Diagnosis Date Noted  . DM (diabetes mellitus) 06/19/2012  . HTN (hypertension) 06/19/2012  . Peripheral neuropathy 06/19/2012   Diabetes mellitus Last hemoglobin A1c was 13.5 Refill Lantus and NovoLog   Hypertension patient was given Norvasc, his lisinopril has been held because he is not aware of this kidney function We'll check a BMP and a CBC and a hemoglobin A1c and a lipid panel today  The patient requested and provided referral to dermatology for a skin tack, podiatry for diabetes, ophthalmology for diabetes  He needs to follow up in 2 weeks

## 2013-01-08 LAB — COMPLETE METABOLIC PANEL WITH GFR
ALT: 21 U/L (ref 0–53)
AST: 13 U/L (ref 0–37)
Albumin: 4.4 g/dL (ref 3.5–5.2)
Alkaline Phosphatase: 106 U/L (ref 39–117)
GFR, Est Non African American: >89 mL/min
Glucose, Bld: 279 mg/dL — ABNORMAL HIGH (ref 70–99)
Potassium: 3.9 mEq/L (ref 3.5–5.3)
Sodium: 137 mEq/L (ref 135–145)
Total Protein: 7.7 g/dL (ref 6.0–8.3)

## 2013-01-08 LAB — LIPID PANEL
HDL: 44 mg/dL (ref >39)
Total CHOL/HDL Ratio: 5.8 Ratio
VLDL: 45 mg/dL — ABNORMAL HIGH (ref 0–40)

## 2013-01-08 LAB — DRUG SCREEN, URINE
Barbiturate Quant, Ur: NEGATIVE
Benzodiazepines.: NEGATIVE
Methadone: NEGATIVE
Propoxyphene: NEGATIVE

## 2013-01-08 LAB — TSH: TSH: 1.143 u[IU]/mL (ref 0.350–4.500)

## 2013-01-21 ENCOUNTER — Ambulatory Visit: Payer: No Typology Code available for payment source

## 2013-03-14 ENCOUNTER — Other Ambulatory Visit: Payer: Self-pay | Admitting: Emergency Medicine

## 2013-03-14 MED ORDER — INSULIN GLARGINE 100 UNIT/ML ~~LOC~~ SOLN: 30.0000 [IU] | Freq: Every day | SUBCUTANEOUS | Status: DC

## 2013-04-16 ENCOUNTER — Ambulatory Visit: Payer: No Typology Code available for payment source

## 2013-06-05 ENCOUNTER — Ambulatory Visit: Payer: No Typology Code available for payment source | Attending: Internal Medicine

## 2013-06-25 ENCOUNTER — Telehealth: Payer: Self-pay

## 2013-06-25 ENCOUNTER — Ambulatory Visit: Payer: Self-pay | Admitting: Internal Medicine

## 2013-07-23 ENCOUNTER — Ambulatory Visit: Payer: No Typology Code available for payment source | Attending: Internal Medicine | Admitting: Internal Medicine

## 2013-07-23 VITALS — BP 130/87 | HR 93 | Temp 98.7°F | Resp 16 | Ht 69.0 in | Wt 233.0 lb

## 2013-07-23 DIAGNOSIS — F17200 Nicotine dependence, unspecified, uncomplicated: Secondary | ICD-10-CM | POA: Insufficient documentation

## 2013-07-23 DIAGNOSIS — E1310 Other specified diabetes mellitus with ketoacidosis without coma: Secondary | ICD-10-CM

## 2013-07-23 DIAGNOSIS — E1110 Type 2 diabetes mellitus with ketoacidosis without coma: Secondary | ICD-10-CM

## 2013-07-23 DIAGNOSIS — I10 Essential (primary) hypertension: Secondary | ICD-10-CM | POA: Insufficient documentation

## 2013-07-23 DIAGNOSIS — E1142 Type 2 diabetes mellitus with diabetic polyneuropathy: Secondary | ICD-10-CM | POA: Insufficient documentation

## 2013-07-23 DIAGNOSIS — E1149 Type 2 diabetes mellitus with other diabetic neurological complication: Secondary | ICD-10-CM | POA: Insufficient documentation

## 2013-07-23 LAB — COMPLETE METABOLIC PANEL WITH GFR
ALBUMIN: 4.3 g/dL (ref 3.5–5.2)
ALK PHOS: 90 U/L (ref 39–117)
ALT: 20 U/L (ref 0–53)
AST: 16 U/L (ref 0–37)
BUN: 11 mg/dL (ref 6–23)
CO2: 29 mEq/L (ref 19–32)
Calcium: 9.9 mg/dL (ref 8.4–10.5)
Chloride: 104 mEq/L (ref 96–112)
Creat: 0.83 mg/dL (ref 0.50–1.35)
GFR, Est African American: >89 mL/min
GLUCOSE: 93 mg/dL (ref 70–99)
POTASSIUM: 3.7 meq/L (ref 3.5–5.3)
SODIUM: 142 meq/L (ref 135–145)
TOTAL PROTEIN: 7.1 g/dL (ref 6.0–8.3)
Total Bilirubin: 0.5 mg/dL (ref 0.2–1.2)

## 2013-07-23 LAB — CBC WITH DIFFERENTIAL/PLATELET
BASOS ABS: 0 10*3/uL (ref 0.0–0.1)
BASOS PCT: 0 % (ref 0–1)
EOS ABS: 0.2 10*3/uL (ref 0.0–0.7)
EOS PCT: 3 % (ref 0–5)
HEMATOCRIT: 43 % (ref 39.0–52.0)
Hemoglobin: 15 g/dL (ref 13.0–17.0)
Lymphocytes Relative: 27 % (ref 12–46)
Lymphs Abs: 2.2 10*3/uL (ref 0.7–4.0)
MCH: 28.4 pg (ref 26.0–34.0)
MCHC: 34.9 g/dL (ref 30.0–36.0)
MCV: 81.4 fL (ref 78.0–100.0)
MONO ABS: 0.7 10*3/uL (ref 0.1–1.0)
Monocytes Relative: 9 % (ref 3–12)
NEUTROS ABS: 4.9 10*3/uL (ref 1.7–7.7)
Neutrophils Relative %: 61 % (ref 43–77)
Platelets: 301 10*3/uL (ref 150–400)
RBC: 5.28 MIL/uL (ref 4.22–5.81)
RDW: 13.5 % (ref 11.5–15.5)
WBC: 8 10*3/uL (ref 4.0–10.5)

## 2013-07-23 LAB — LIPID PANEL
CHOLESTEROL: 219 mg/dL — AB (ref 0–200)
HDL: 52 mg/dL (ref >39)
LDL Cholesterol: 140 mg/dL — ABNORMAL HIGH (ref 0–99)
TRIGLYCERIDES: 134 mg/dL (ref <150)
Total CHOL/HDL Ratio: 4.2 Ratio
VLDL: 27 mg/dL (ref 0–40)

## 2013-07-23 LAB — POCT GLYCOSYLATED HEMOGLOBIN (HGB A1C): Hemoglobin A1C: 9.3

## 2013-07-23 LAB — GLUCOSE, POCT (MANUAL RESULT ENTRY): POC Glucose: 119 mg/dl — AB (ref 70–99)

## 2013-07-23 MED ORDER — AMLODIPINE BESYLATE 10 MG PO TABS: 10.0000 mg | ORAL_TABLET | Freq: Every day | ORAL | Status: DC

## 2013-07-23 MED ORDER — INSULIN PEN NEEDLE 31G X 8 MM MISC: Status: AC

## 2013-07-23 MED ORDER — INSULIN ASPART 100 UNIT/ML ~~LOC~~ SOLN: SUBCUTANEOUS | Status: DC

## 2013-07-23 MED ORDER — ASPIRIN 81 MG PO TABS: 81.0000 mg | ORAL_TABLET | Freq: Every day | ORAL | Status: AC

## 2013-07-23 MED ORDER — GABAPENTIN 300 MG PO CAPS: 300.0000 mg | ORAL_CAPSULE | Freq: Two times a day (BID) | ORAL | Status: DC

## 2013-07-23 MED ORDER — FREESTYLE SYSTEM KIT: 1.0000 | PACK | Status: AC | PRN

## 2013-07-23 MED ORDER — GLUCOSE BLOOD VI STRP: ORAL_STRIP | Status: AC

## 2013-07-23 MED ORDER — NICOTINE 14 MG/24HR TD PT24: 14.0000 mg | MEDICATED_PATCH | Freq: Every day | TRANSDERMAL | Status: DC

## 2013-07-23 MED ORDER — INSULIN GLARGINE 100 UNIT/ML ~~LOC~~ SOLN: 30.0000 [IU] | Freq: Every day | SUBCUTANEOUS | Status: DC

## 2013-07-23 MED ORDER — LISINOPRIL 10 MG PO TABS: 10.0000 mg | ORAL_TABLET | Freq: Every day | ORAL | Status: AC

## 2013-07-23 NOTE — Progress Notes (Signed)
Patient ID: Larry Mercer, male   DOB: 1975/11/25, [age] y.o.   MRN: [id number]   CC:  HPI: 38 year old male here for a followup of his hypertension and diabetes, the patient does not always check a CBG on a regular basis, he usually stays between 100-220. His last A1c was greater than 10. Patient continues to smoke a pack a day. He's concerned about his feet and wants to see her podiatrist. He received a notification for keeping his ophthalmology appointment in the mail but he cannot afford it. He works as a Copyjanitor. Complains of numbness and tingling in his feet   No Known Allergies Past Medical History  Diagnosis Date  . Diabetes mellitus   . Hypertension    Current Outpatient Prescriptions on File Prior to Visit  Medication Sig Dispense Refill  . insulin aspart (NOVOLOG FLEXPEN) 100 UNIT/ML injection As directed for meal coverage based on sliding scale.  1 pen  20  . Lancets MISC For TID CBG testing  200 each  11  . traMADol (ULTRAM) 50 MG tablet Take 1 tablet (50 mg total) by mouth every 6 (six) hours as needed for pain.  20 tablet  0  . traMADol (ULTRAM) 50 MG tablet Take 1 tablet (50 mg total) by mouth every 12 (twelve) hours.  60 tablet  3   No current facility-administered medications on file prior to visit.   History reviewed. No pertinent family history. History   Social History  . Marital Status: Married    Spouse Name: N/A    Number of Children: N/A  . Years of Education: N/A   Occupational History  . Not on file.   Social History Main Topics  . Smoking status: Current Every Day Smoker -- 1.00 packs/day    Types: Cigarettes  . Smokeless tobacco: Not on file  . Alcohol Use: No     Comment: occasional  . Drug Use: No  . Sexual Activity: Yes   Other Topics Concern  . Not on file   Social History Narrative  . No narrative on file    Review of Systems  Constitutional: As in history of present illness HENT: Negative for ear pain, nosebleeds, congestion,  facial swelling, rhinorrhea, neck pain, neck stiffness and ear discharge.   Eyes: Negative for pain, discharge, redness, itching and visual disturbance.  Respiratory: Negative for cough, choking, chest tightness, shortness of breath, wheezing and stridor.   Cardiovascular: Negative for chest pain, palpitations and leg swelling.  Gastrointestinal: Negative for abdominal distention.  Genitourinary: Negative for dysuria, urgency, frequency, hematuria, flank pain, decreased urine volume, difficulty urinating and dyspareunia.  Musculoskeletal: Negative for back pain, joint swelling, arthralgias and gait problem.  Neurological: As in history of present illness  Hematological: Negative for adenopathy. Does not bruise/bleed easily.  Psychiatric/Behavioral: Negative for hallucinations, behavioral problems, confusion, dysphoric mood, decreased concentration and agitation.    Objective:   Filed Vitals:   07/23/13 1455  BP: 130/87  Pulse: 93  Temp: 98.7 F (37.1 C)  Resp: 16    Physical Exam  Constitutional: Appears well-developed and well-nourished. No distress.  HENT: Normocephalic. External right and left ear normal. Oropharynx is clear and moist.  Eyes: Conjunctivae and EOM are normal. PERRLA, no scleral icterus.  Neck: Normal ROM. Neck supple. No JVD. No tracheal deviation. No thyromegaly.  CVS: RRR, S1/S2 +, no murmurs, no gallops, no carotid bruit.  Pulmonary: Effort and breath sounds normal, no stridor, rhonchi, wheezes, rales.  Abdominal: Soft. BS +,  no distension,  tenderness, rebound or guarding.  Musculoskeletal: Normal range of motion. No edema and no tenderness.  Lymphadenopathy: No lymphadenopathy noted, cervical, inguinal. Neuro: Alert. Normal reflexes, muscle tone coordination. No cranial nerve deficit. Skin: Skin is warm and dry. No rash noted. Not diaphoretic. No erythema. No pallor.  Psychiatric: Normal mood and affect. Behavior, judgment, thought content normal.   Lab  Results  Component Value Date   WBC 7.9 01/07/2013   HGB 15.9 01/07/2013   HCT 46.9 01/07/2013   MCV 85.0 01/07/2013   PLT 292 01/07/2013   Lab Results  Component Value Date   CREATININE 1.01 01/07/2013   BUN 12 01/07/2013   NA 137 01/07/2013   K 3.9 01/07/2013   CL 101 01/07/2013   CO2 28 01/07/2013    Lab Results  Component Value Date   HGBA1C 9.3 07/23/2013   Lipid Panel     Component Value Date/Time   CHOL 256* 01/07/2013 1544   TRIG 223* 01/07/2013 1544   HDL 44 01/07/2013 1544   CHOLHDL 5.8 01/07/2013 1544   VLDL 45* 01/07/2013 1544   LDLCALC 167* 01/07/2013 1544       Assessment and plan:   Patient Active Problem List   Diagnosis Date Noted  . DM (diabetes mellitus) 06/19/2012  . HTN (hypertension) 06/19/2012  . Peripheral neuropathy 06/19/2012       Diabetes, uncontrolled/diabetic neuropathy A1c 9.3, CBG 119 Continue insulin, Lantus 30 units at bedtime, NovoLog 3-4 units before each meal Gabapentin for neuropathy Ophthalmology referral   Hypertension Continue lisinopril, Norvasc, blood pressure controlled Renal panel today   Smoking cessation nicotine patch prescribed   Followup in 3 months   The patient was given clear instructions to go to ER or return to medical center if symptoms don't improve, worsen or new problems develop. The patient verbalized understanding. The patient was told to call to get any lab results if not heard anything in the next week.

## 2013-07-23 NOTE — Progress Notes (Signed)
Pt is here following up on his HTN and diabetes. 

## 2013-07-24 LAB — VITAMIN D 25 HYDROXY (VIT D DEFICIENCY, FRACTURES): VIT D 25 HYDROXY: 18 ng/mL — AB (ref 30–89)

## 2013-07-25 ENCOUNTER — Telehealth: Payer: Self-pay | Admitting: *Deleted

## 2013-07-25 MED ORDER — VITAMIN D (ERGOCALCIFEROL) 1.25 MG (50000 UNIT) PO CAPS: 50000.0000 [IU] | ORAL_CAPSULE | ORAL | Status: AC

## 2013-07-25 NOTE — Telephone Encounter (Signed)
Message copied by Anjana Cheek, [place] on Thu Jul 25, 2013  3:57 PM ------      Message from: Susie CassetteABROL MD, [facility]      Created: Wed Jul 24, 2013  9:54 AM       Notify patient of labs are normal with the exception of vitamin D, please call in a prescription for vitamin D 50,000 international units weekly, 10 tablets with 0 refills ------

## 2013-07-25 NOTE — Telephone Encounter (Signed)
Left a voicemail for pt to give us a call back. Prescription for Vitamin D was sent to our pharmacy.

## 2013-10-21 ENCOUNTER — Ambulatory Visit: Payer: No Typology Code available for payment source | Admitting: Internal Medicine

## 2014-03-13 ENCOUNTER — Ambulatory Visit: Payer: Self-pay | Attending: Internal Medicine

## 2014-03-13 ENCOUNTER — Ambulatory Visit: Payer: Self-pay | Admitting: Internal Medicine

## 2014-03-19 ENCOUNTER — Ambulatory Visit: Payer: Self-pay | Attending: Internal Medicine | Admitting: Internal Medicine

## 2014-03-19 ENCOUNTER — Other Ambulatory Visit: Payer: Self-pay | Admitting: Internal Medicine

## 2014-03-19 ENCOUNTER — Encounter: Payer: Self-pay | Admitting: Internal Medicine

## 2014-03-19 VITALS — BP 129/89 | HR 89 | Temp 98.4°F | Resp 16 | Ht 69.0 in | Wt 237.0 lb

## 2014-03-19 DIAGNOSIS — E785 Hyperlipidemia, unspecified: Secondary | ICD-10-CM

## 2014-03-19 DIAGNOSIS — F17200 Nicotine dependence, unspecified, uncomplicated: Secondary | ICD-10-CM

## 2014-03-19 DIAGNOSIS — Z794 Long term (current) use of insulin: Secondary | ICD-10-CM | POA: Insufficient documentation

## 2014-03-19 DIAGNOSIS — I10 Essential (primary) hypertension: Secondary | ICD-10-CM

## 2014-03-19 DIAGNOSIS — Z2821 Immunization not carried out because of patient refusal: Secondary | ICD-10-CM

## 2014-03-19 DIAGNOSIS — E119 Type 2 diabetes mellitus without complications: Secondary | ICD-10-CM

## 2014-03-19 LAB — GLUCOSE, POCT (MANUAL RESULT ENTRY): POC Glucose: 285 mg/dl — AB (ref 70–99)

## 2014-03-19 LAB — POCT GLYCOSYLATED HEMOGLOBIN (HGB A1C): Hemoglobin A1C: 10

## 2014-03-19 MED ORDER — INSULIN GLARGINE 300 UNIT/ML ~~LOC~~ SOPN: 15.0000 [IU] | PEN_INJECTOR | Freq: Every day | SUBCUTANEOUS | Status: AC

## 2014-03-19 MED ORDER — ATORVASTATIN CALCIUM 10 MG PO TABS
10.0000 mg | ORAL_TABLET | Freq: Every day | ORAL | Status: DC
Start: 2014-03-19 — End: 2015-06-15

## 2014-03-19 MED ORDER — INSULIN ASPART 100 UNIT/ML ~~LOC~~ SOLN: SUBCUTANEOUS | Status: DC

## 2014-03-19 MED ORDER — METFORMIN HCL 500 MG PO TABS: 500.0000 mg | ORAL_TABLET | Freq: Two times a day (BID) | ORAL | Status: DC

## 2014-03-19 MED ORDER — INSULIN GLARGINE 100 UNIT/ML SOLOSTAR PEN: 30.0000 [IU] | PEN_INJECTOR | Freq: Every day | SUBCUTANEOUS | Status: DC

## 2014-03-19 MED ORDER — INSULIN ASPART 100 UNIT/ML ~~LOC~~ SOLN: 3.0000 [IU] | Freq: Three times a day (TID) | SUBCUTANEOUS | Status: DC

## 2014-03-19 NOTE — Patient Instructions (Signed)
Smoking Cessation Quitting smoking is important to your health and has many advantages. However, it is not always easy to quit since nicotine is a very addictive drug. Oftentimes, people try 3 times or more before being able to quit. This document explains the best ways for you to prepare to quit smoking. Quitting takes hard work and a lot of effort, but you can do it. ADVANTAGES OF QUITTING SMOKING  You will live longer, feel better, and live better.  Your body will feel the impact of quitting smoking almost immediately.  Within 20 minutes, blood pressure decreases. Your pulse returns to its normal level.  After 8 hours, carbon monoxide levels in the blood return to normal. Your oxygen level increases.  After 24 hours, the chance of having a heart attack starts to decrease. Your breath, hair, and body stop smelling like smoke.  After 48 hours, damaged nerve endings begin to recover. Your sense of taste and smell improve.  After 72 hours, the body is virtually free of nicotine. Your bronchial tubes relax and breathing becomes easier.  After 2 to 12 weeks, lungs can hold more air. Exercise becomes easier and circulation improves.  The risk of having a heart attack, stroke, cancer, or lung disease is greatly reduced.  After 1 year, the risk of coronary heart disease is cut in half.  After 5 years, the risk of stroke falls to the same as a nonsmoker.  After 10 years, the risk of lung cancer is cut in half and the risk of other cancers decreases significantly.  After 15 years, the risk of coronary heart disease drops, usually to the level of a nonsmoker.  If you are pregnant, quitting smoking will improve your chances of having a healthy baby.  The people you live with, especially any children, will be healthier.  You will have extra money to spend on things other than cigarettes. QUESTIONS TO THINK ABOUT BEFORE ATTEMPTING TO QUIT You may want to talk about your answers with your  health care provider.  Why do you want to quit?  If you tried to quit in the past, what helped and what did not?  What will be the most difficult situations for you after you quit? How will you plan to handle them?  Who can help you through the tough times? Your family? Friends? A health care provider?  What pleasures do you get from smoking? What ways can you still get pleasure if you quit? Here are some questions to ask your health care provider:  How can you help me to be successful at quitting?  What medicine do you think would be best for me and how should I take it?  What should I do if I need more help?  What is smoking withdrawal like? How can I get information on withdrawal? GET READY  Set a quit date.  Change your environment by getting rid of all cigarettes, ashtrays, matches, and lighters in your home, car, or work. Do not let people smoke in your home.  Review your past attempts to quit. Think about what worked and what did not. GET SUPPORT AND ENCOURAGEMENT You have a better chance of being successful if you have help. You can get support in many ways.  Tell your family, friends, and coworkers that you are going to quit and need their support. Ask them not to smoke around you.  Get individual, group, or telephone counseling and support. Programs are available at local hospitals and health centers. Call   your local health department for information about programs in your area.  Spiritual beliefs and practices may help some smokers quit.  Download a "quit meter" on your computer to keep track of quit statistics, such as how long you have gone without smoking, cigarettes not smoked, and money saved.  Get a self-help book about quitting smoking and staying off tobacco. LEARN NEW SKILLS AND BEHAVIORS  Distract yourself from urges to smoke. Talk to someone, go for a walk, or occupy your time with a task.  Change your normal routine. Take a different route to work.  Drink tea instead of coffee. Eat breakfast in a different place.  Reduce your stress. Take a hot bath, exercise, or read a book.  Plan something enjoyable to do every day. Reward yourself for not smoking.  Explore interactive web-based programs that specialize in helping you quit. GET MEDICINE AND USE IT CORRECTLY Medicines can help you stop smoking and decrease the urge to smoke. Combining medicine with the above behavioral methods and support can greatly increase your chances of successfully quitting smoking.  Nicotine replacement therapy helps deliver nicotine to your body without the negative effects and risks of smoking. Nicotine replacement therapy includes nicotine gum, lozenges, inhalers, nasal sprays, and skin patches. Some may be available over-the-counter and others require a prescription.  Antidepressant medicine helps people abstain from smoking, but how this works is unknown. This medicine is available by prescription.  Nicotinic receptor partial agonist medicine simulates the effect of nicotine in your brain. This medicine is available by prescription. Ask your health care provider for advice about which medicines to use and how to use them based on your health history. Your health care provider will tell you what side effects to look out for if you choose to be on a medicine or therapy. Carefully read the information on the package. Do not use any other product containing nicotine while using a nicotine replacement product.  RELAPSE OR DIFFICULT SITUATIONS Most relapses occur within the first 3 months after quitting. Do not be discouraged if you start smoking again. Remember, most people try several times before finally quitting. You may have symptoms of withdrawal because your body is used to nicotine. You may crave cigarettes, be irritable, feel very hungry, cough often, get headaches, or have difficulty concentrating. The withdrawal symptoms are only temporary. They are strongest  when you first quit, but they will go away within 10-14 days. To reduce the chances of relapse, try to:  Avoid drinking alcohol. Drinking lowers your chances of successfully quitting.  Reduce the amount of caffeine you consume. Once you quit smoking, the amount of caffeine in your body increases and can give you symptoms, such as a rapid heartbeat, sweating, and anxiety.  Avoid smokers because they can make you want to smoke.  Do not let weight gain distract you. Many smokers will gain weight when they quit, usually less than 10 pounds. Eat a healthy diet and stay active. You can always lose the weight gained after you quit.  Find ways to improve your mood other than smoking. FOR MORE INFORMATION  www.smokefree.gov  Document Released: 05/31/2001 Document Revised: 10/21/2013 Document Reviewed: 09/15/2011 ExitCare Patient Information 2015 [employer]. This information is not intended to replace advice given to you by your health care provider. Make sure you discuss any questions you have with your health care provider. DASH Eating Plan DASH stands for "Dietary Approaches to Stop Hypertension." The DASH eating plan is a healthy eating plan that has   been shown to reduce high blood pressure (hypertension). Additional health benefits may include reducing the risk of type 2 diabetes mellitus, heart disease, and stroke. The DASH eating plan may also help with weight loss. WHAT DO I NEED TO KNOW ABOUT THE DASH EATING PLAN? For the DASH eating plan, you will follow these general guidelines:  Choose foods with a percent daily value for sodium of less than 5% (as listed on the food label).  Use salt-free seasonings or herbs instead of table salt or sea salt.  Check with your health care provider or pharmacist before using salt substitutes.  Eat lower-sodium products, often labeled as "lower sodium" or "no salt added."  Eat fresh foods.  Eat more vegetables, fruits, and low-fat dairy  products.  Choose whole grains. Look for the word "whole" as the first word in the ingredient list.  Choose fish and skinless chicken or turkey more often than red meat. Limit fish, poultry, and meat to 6 oz (170 g) each day.  Limit sweets, desserts, sugars, and sugary drinks.  Choose heart-healthy fats.  Limit cheese to 1 oz (28 g) per day.  Eat more home-cooked food and less restaurant, buffet, and fast food.  Limit fried foods.  Cook foods using methods other than frying.  Limit canned vegetables. If you do use them, rinse them well to decrease the sodium.  When eating at a restaurant, ask that your food be prepared with less salt, or no salt if possible. WHAT FOODS CAN I EAT? Seek help from a dietitian for individual calorie needs. Grains Whole grain or whole wheat bread. Brown rice. Whole grain or whole wheat pasta. Quinoa, bulgur, and whole grain cereals. Low-sodium cereals. Corn or whole wheat flour tortillas. Whole grain cornbread. Whole grain crackers. Low-sodium crackers. Vegetables Fresh or frozen vegetables (raw, steamed, roasted, or grilled). Low-sodium or reduced-sodium tomato and vegetable juices. Low-sodium or reduced-sodium tomato sauce and paste. Low-sodium or reduced-sodium canned vegetables.  Fruits All fresh, canned (in natural juice), or frozen fruits. Meat and Other Protein Products Ground beef (85% or leaner), grass-fed beef, or beef trimmed of fat. Skinless chicken or turkey. Ground chicken or turkey. Pork trimmed of fat. All fish and seafood. Eggs. Dried beans, peas, or lentils. Unsalted nuts and seeds. Unsalted canned beans. Dairy Low-fat dairy products, such as skim or 1% milk, 2% or reduced-fat cheeses, low-fat ricotta or cottage cheese, or plain low-fat yogurt. Low-sodium or reduced-sodium cheeses. Fats and Oils Tub margarines without trans fats. Light or reduced-fat mayonnaise and salad dressings (reduced sodium). Avocado. Safflower, olive, or canola  oils. Natural peanut or almond butter. Other Unsalted popcorn and pretzels. The items listed above may not be a complete list of recommended foods or beverages. Contact your dietitian for more options. WHAT FOODS ARE NOT RECOMMENDED? Grains White bread. White pasta. White rice. Refined cornbread. Bagels and croissants. Crackers that contain trans fat. Vegetables Creamed or fried vegetables. Vegetables in a cheese sauce. Regular canned vegetables. Regular canned tomato sauce and paste. Regular tomato and vegetable juices. Fruits Dried fruits. Canned fruit in light or heavy syrup. Fruit juice. Meat and Other Protein Products Fatty cuts of meat. Ribs, chicken wings, bacon, sausage, bologna, salami, chitterlings, fatback, hot dogs, bratwurst, and packaged luncheon meats. Salted nuts and seeds. Canned beans with salt. Dairy Whole or 2% milk, cream, half-and-half, and cream cheese. Whole-fat or sweetened yogurt. Full-fat cheeses or blue cheese. Nondairy creamers and whipped toppings. Processed cheese, cheese spreads, or cheese curds. Condiments Onion and garlic salt,   seasoned salt, table salt, and sea salt. Canned and packaged gravies. Worcestershire sauce. Tartar sauce. Barbecue sauce. Teriyaki sauce. Soy sauce, including reduced sodium. Steak sauce. Fish sauce. Oyster sauce. Cocktail sauce. Horseradish. Ketchup and mustard. Meat flavorings and tenderizers. Bouillon cubes. Hot sauce. Tabasco sauce. Marinades. Taco seasonings. Relishes. Fats and Oils Butter, stick margarine, lard, shortening, ghee, and bacon fat. Coconut, palm kernel, or palm oils. Regular salad dressings. Other Pickles and olives. Salted popcorn and pretzels. The items listed above may not be a complete list of foods and beverages to avoid. Contact your dietitian for more information. WHERE CAN I FIND MORE INFORMATION? National Heart, Lung, and [employer] Institute: www.nhlbi.nih.gov/health/health-topics/topics/dash/ Document Released:  05/26/2011 Document Revised: 10/21/2013 Document Reviewed: 04/10/2013 ExitCare Patient Information 2015 [employer]. This information is not intended to replace advice given to you by your health care provider. Make sure you discuss any questions you have with your health care provider.  

## 2014-03-19 NOTE — Progress Notes (Signed)
Pt is here following up on his HTN and diabetes. 

## 2014-03-28 ENCOUNTER — Other Ambulatory Visit: Payer: Self-pay

## 2014-04-11 ENCOUNTER — Ambulatory Visit: Payer: Self-pay | Attending: Internal Medicine

## 2014-04-11 DIAGNOSIS — E119 Type 2 diabetes mellitus without complications: Secondary | ICD-10-CM

## 2014-04-11 LAB — GLUCOSE, POCT (MANUAL RESULT ENTRY): POC Glucose: 182 mg/dl — AB (ref 70–99)

## 2014-04-11 NOTE — Patient Instructions (Signed)
Pt is to return next week with an accurate and more detail BS log. Today you blood sugar is not too out of control so continue taking medications as prescribed and you will follow up next week for another lab visit to check your CBG and you log.

## 2014-04-11 NOTE — Progress Notes (Signed)
Pt is here for a BS check.

## 2014-04-15 NOTE — Progress Notes (Signed)
Patient ID: Larry Mercer, male   DOB: [birth year], 38 y.o.   MRN: [id number]  CC: Followup diabetes hypertension  HPI:  Patient presents today for followup for her diabetes and hypertension. Patient has previously been taken Levemir 30 units at nighttime with NovoLog sliding scale coverage at mealtime. Patient reports that his blood sugars had been managing.  No Known Allergies Past Medical History  Diagnosis Date  . Diabetes mellitus   . Hypertension    Current Outpatient Prescriptions on File Prior to Visit  Medication Sig Dispense Refill  . amLODipine (NORVASC) 10 MG tablet Take 1 tablet (10 mg total) by mouth daily.  90 tablet  3  . aspirin 81 MG tablet Take 1 tablet (81 mg total) by mouth daily.  90 tablet  3  . gabapentin (NEURONTIN) 300 MG capsule Take 1 capsule (300 mg total) by mouth 2 (two) times daily.  60 capsule  3  . glucose blood test strip Use as instructed  100 each  12  . glucose monitoring kit (FREESTYLE) monitoring kit 1 each by Does not apply route as needed for other. Check CBG 3 times a day  1 each  2  . insulin aspart (NOVOLOG FLEXPEN) 100 UNIT/ML injection As directed for meal coverage based on sliding scale.  1 pen  20  . Insulin Pen Needle (1ST CHOICE PEN NEEDLES) 31G X 8 MM MISC Use new pen needle with each injection-Lantus and Novolog pen.  100 each  11  . Lancets MISC For TID CBG testing  200 each  11  . lisinopril (PRINIVIL,ZESTRIL) 10 MG tablet Take 1 tablet (10 mg total) by mouth daily.  90 tablet  3  . Vitamin D, Ergocalciferol, (DRISDOL) 50000 UNITS CAPS capsule Take 1 capsule (50,000 Units total) by mouth every 7 (seven) days.  10 capsule  0  . nicotine (EQ NICOTINE) 14 mg/24hr patch Place 1 patch (14 mg total) onto the skin daily.  28 patch  0  . traMADol (ULTRAM) 50 MG tablet Take 1 tablet (50 mg total) by mouth every 12 (twelve) hours.  60 tablet  3   No current facility-administered medications on file prior to visit.   History reviewed. No pertinent  family history. History   Social History  . Marital Status: Married    Spouse Name: N/A    Number of Children: N/A  . Years of Education: N/A   Occupational History  . Not on file.   Social History Main Topics  . Smoking status: Current Every Day Smoker -- 1.00 packs/day    Types: Cigarettes  . Smokeless tobacco: Not on file  . Alcohol Use: No     Comment: occasional  . Drug Use: No  . Sexual Activity: Yes   Other Topics Concern  . Not on file   Social History Narrative  . No narrative on file    Review of Systems: Constitutional: Negative for fever, chills, diaphoresis, activity change, appetite change and fatigue. HENT: Negative for ear pain, nosebleeds, congestion, facial swelling, rhinorrhea, neck pain, neck stiffness and ear discharge.  Eyes: Negative for pain, discharge, redness, itching and visual disturbance. Respiratory: Negative for cough, choking, chest tightness, shortness of breath, wheezing and stridor.  Cardiovascular: Negative for chest pain, palpitations and leg swelling. Gastrointestinal: Negative for abdominal distention. Genitourinary: Negative for dysuria, urgency, frequency, hematuria, flank pain, decreased urine volume, difficulty urinating and dyspareunia.  Musculoskeletal: Negative for back pain, joint swelling, arthralgias and gait problem. Neurological: Negative for dizziness, tremors, seizures, syncope,  facial asymmetry, speech difficulty, weakness, light-headedness, numbness and headaches.  Hematological: Negative for adenopathy. Does not bruise/bleed easily. Psychiatric/Behavioral: Negative for hallucinations, behavioral problems, confusion, dysphoric mood, decreased concentration and agitation.    Objective:   Filed Vitals:   03/19/14 1631  BP: 129/89  Pulse: 89  Temp: 98.4 F (36.9 C)  Resp: 16    Physical Exam: Constitutional: Patient appears well-developed and well-nourished. No distress. HENT: Normocephalic, atraumatic,  External right and left ear normal. Oropharynx is clear and moist.  Eyes: Conjunctivae and EOM are normal. PERRLA, no scleral icterus. Neck: Normal ROM. Neck supple. No JVD. No tracheal deviation. No thyromegaly. CVS: RRR, S1/S2 +, no murmurs, no gallops, no carotid bruit.  Pulmonary: Effort and breath sounds normal, no stridor, rhonchi, wheezes, rales.  Abdominal: Soft. BS +,  no distension, tenderness, rebound or guarding.  Musculoskeletal: Normal range of motion. No edema and no tenderness.  Lymphadenopathy: No lymphadenopathy noted, cervical Neuro: Alert. Normal reflexes, muscle tone coordination. No cranial nerve deficit. Skin: Skin is warm and dry. No rash noted. Not diaphoretic. No erythema. No pallor. Psychiatric: Normal mood and affect. Behavior, judgment, thought content normal.  Lab Results  Component Value Date   WBC 8.0 07/23/2013   HGB 15.0 07/23/2013   HCT 43.0 07/23/2013   MCV 81.4 07/23/2013   PLT 301 07/23/2013   Lab Results  Component Value Date   CREATININE 0.83 07/23/2013   BUN 11 07/23/2013   NA 142 07/23/2013   K 3.7 07/23/2013   CL 104 07/23/2013   CO2 29 07/23/2013    Lab Results  Component Value Date   HGBA1C 10.0 03/19/2014   Lipid Panel     Component Value Date/Time   CHOL 219* 07/23/2013 1525   TRIG 134 07/23/2013 1525   HDL 52 07/23/2013 1525   CHOLHDL 4.2 07/23/2013 1525   VLDL 27 07/23/2013 1525   LDLCALC 140* 07/23/2013 1525       Assessment and plan:   Larry Mercer was seen today for follow-up.  Diagnoses and associated orders for this visit:  Type 2 diabetes mellitus without complication - Glucose (CBG) - HgB A1c - Change to Insulin Glargine (TOUJEO SOLOSTAR) 300 UNIT/ML SOPN; Inject 15 Units into the skin daily at 10 pm. Do to cost of Levemir - metFORMIN (GLUCOPHAGE) 500 MG tablet; Take 1 tablet (500 mg total) by mouth 2 (two) times daily with a meal. - insulin aspart (NOVOLOG) 100 UNIT/ML injection; Inject 3-4 units with meals and as directed for CBG  coverage  Essential hypertension Continue lisinopril, amlodipine  HLD (hyperlipidemia) - atorvastatin (LIPITOR) 10 MG tablet; Take 1 tablet (10 mg total) by mouth daily. For cholesterol  Tobacco use disorder Discussed in detail-related comorbidities  Refused influenza vaccine   Return in about 1 week (around 03/26/2014) for  and 3 week Nurse Visit-CBG log check and 3 mo PCP.        Chari Manning, NP-C [facility] [phone] 04/15/2014, 2:15 PM

## 2014-04-16 ENCOUNTER — Ambulatory Visit: Payer: Self-pay | Attending: Internal Medicine

## 2014-04-17 ENCOUNTER — Ambulatory Visit: Payer: Self-pay | Attending: Internal Medicine

## 2014-04-17 DIAGNOSIS — E119 Type 2 diabetes mellitus without complications: Secondary | ICD-10-CM | POA: Insufficient documentation

## 2014-04-17 LAB — GLUCOSE, POCT (MANUAL RESULT ENTRY): POC Glucose: 177 mg/dl — AB (ref 70–99)

## 2014-04-17 NOTE — Progress Notes (Signed)
Pt comes in for nurse visit to check blood sugar with log Pt was started on Toujeo Solostar Pen injecting 15 units @ 10pm, with Metformin 500 mg tab BID, and Novolog sliding scale  States he is compliant with taking medication daily as well as watching what he eats daily States he has cut back on Soda,smoking and eating more low carbohydrate foods CBG- 177 with log book dated 10/23-10/29 160's-to now 140's Informed pt we are very proud of him and keep up the good work!! Instructed pt to bring log book with him during next office visit

## 2014-04-17 NOTE — Addendum Note (Signed)
Addended by: Nonnie DoneSMITH, JILL D on: 04/17/2014 11:08 AM   Modules accepted: Orders

## 2014-04-17 NOTE — Patient Instructions (Signed)
Continue taking prescribed Diabetes medication You are doing very well! Vikki PortsValerie will be proud [id number] ,continue keep track of reading  Continue eating healthy/diet Return with log at next scheduled office visit

## 2014-05-08 ENCOUNTER — Other Ambulatory Visit: Payer: Self-pay | Admitting: Internal Medicine

## 2014-10-03 ENCOUNTER — Other Ambulatory Visit: Payer: Self-pay | Admitting: Internal Medicine

## 2014-12-16 ENCOUNTER — Other Ambulatory Visit: Payer: Self-pay | Admitting: Internal Medicine

## 2014-12-16 DIAGNOSIS — E119 Type 2 diabetes mellitus without complications: Secondary | ICD-10-CM

## 2014-12-16 MED ORDER — METFORMIN HCL 500 MG PO TABS: 500.0000 mg | ORAL_TABLET | Freq: Two times a day (BID) | ORAL | Status: AC

## 2015-05-01 ENCOUNTER — Emergency Department (HOSPITAL_BASED_OUTPATIENT_CLINIC_OR_DEPARTMENT_OTHER)
Admission: EM | Admit: 2015-05-01 | Discharge: 2015-05-01 | Disposition: A | Discharge status: Home / Self Care | Payer: No Typology Code available for payment source | Attending: Emergency Medicine | Admitting: Emergency Medicine

## 2015-05-01 ENCOUNTER — Emergency Department (HOSPITAL_BASED_OUTPATIENT_CLINIC_OR_DEPARTMENT_OTHER): Payer: No Typology Code available for payment source

## 2015-05-01 ENCOUNTER — Encounter (HOSPITAL_BASED_OUTPATIENT_CLINIC_OR_DEPARTMENT_OTHER): Payer: Self-pay | Admitting: Emergency Medicine

## 2015-05-01 DIAGNOSIS — Y92410 Unspecified street and highway as the place of occurrence of the external cause: Secondary | ICD-10-CM | POA: Diagnosis not present

## 2015-05-01 DIAGNOSIS — Z7982 Long term (current) use of aspirin: Secondary | ICD-10-CM | POA: Insufficient documentation

## 2015-05-01 DIAGNOSIS — S161XXA Strain of muscle, fascia and tendon at neck level, initial encounter: Secondary | ICD-10-CM | POA: Insufficient documentation

## 2015-05-01 DIAGNOSIS — R739 Hyperglycemia, unspecified: Secondary | ICD-10-CM

## 2015-05-01 DIAGNOSIS — S39012A Strain of muscle, fascia and tendon of lower back, initial encounter: Secondary | ICD-10-CM | POA: Diagnosis not present

## 2015-05-01 DIAGNOSIS — Y9389 Activity, other specified: Secondary | ICD-10-CM | POA: Diagnosis not present

## 2015-05-01 DIAGNOSIS — Y998 Other external cause status: Secondary | ICD-10-CM | POA: Diagnosis not present

## 2015-05-01 DIAGNOSIS — Z720 Tobacco use: Secondary | ICD-10-CM | POA: Diagnosis not present

## 2015-05-01 DIAGNOSIS — E1165 Type 2 diabetes mellitus with hyperglycemia: Secondary | ICD-10-CM | POA: Diagnosis not present

## 2015-05-01 DIAGNOSIS — Z79899 Other long term (current) drug therapy: Secondary | ICD-10-CM | POA: Diagnosis not present

## 2015-05-01 DIAGNOSIS — E119 Type 2 diabetes mellitus without complications: Secondary | ICD-10-CM

## 2015-05-01 DIAGNOSIS — I10 Essential (primary) hypertension: Secondary | ICD-10-CM | POA: Insufficient documentation

## 2015-05-01 DIAGNOSIS — Z794 Long term (current) use of insulin: Secondary | ICD-10-CM | POA: Diagnosis not present

## 2015-05-01 DIAGNOSIS — Z7984 Long term (current) use of oral hypoglycemic drugs: Secondary | ICD-10-CM | POA: Diagnosis not present

## 2015-05-01 DIAGNOSIS — S3992XA Unspecified injury of lower back, initial encounter: Secondary | ICD-10-CM | POA: Diagnosis present

## 2015-05-01 LAB — URINALYSIS, ROUTINE W REFLEX MICROSCOPIC
BILIRUBIN URINE: NEGATIVE
Glucose, UA: >1000 mg/dL — AB
Hgb urine dipstick: NEGATIVE
Ketones, ur: NEGATIVE mg/dL
Leukocytes, UA: NEGATIVE
Nitrite: NEGATIVE
PH: 5 (ref 5.0–8.0)
Protein, ur: NEGATIVE mg/dL
SPECIFIC GRAVITY, URINE: 1.038 — AB (ref 1.005–1.030)
Urobilinogen, UA: 0.2 mg/dL (ref 0.0–1.0)

## 2015-05-01 LAB — URINE MICROSCOPIC-ADD ON

## 2015-05-01 LAB — CBG MONITORING, ED: Glucose-Capillary: 316 mg/dL — ABNORMAL HIGH (ref 65–99)

## 2015-05-01 MED ORDER — HYDROCODONE-ACETAMINOPHEN 5-325 MG PO TABS: ORAL_TABLET | ORAL | Status: AC

## 2015-05-01 MED ORDER — AMLODIPINE BESYLATE 10 MG PO TABS: 10.0000 mg | ORAL_TABLET | Freq: Every day | ORAL | Status: AC

## 2015-05-01 MED ORDER — IBUPROFEN 400 MG PO TABS
400.0000 mg | ORAL_TABLET | Freq: Once | ORAL | Status: AC
  Administered 2015-05-01: 400 mg via ORAL
  Filled 2015-05-01: qty 1

## 2015-05-01 MED ORDER — INSULIN ASPART 100 UNIT/ML ~~LOC~~ SOLN: SUBCUTANEOUS | Status: DC

## 2015-05-01 NOTE — ED Notes (Signed)
Pt reports he is Diabetic but is out of his insulin and pills.
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
## 2015-05-01 NOTE — Discharge Instructions (Signed)
Please follow with your primary care doctor in the next 2 days for a check-up. They must obtain records for further management.   Do not hesitate to return to the Emergency Department for any new, worsening or concerning symptoms.   Take vicodin for breakthrough pain, do not drink alcohol, drive, care for children or do other critical tasks while taking vicodin.   Cervical Sprain A cervical sprain is an injury in the neck in which the strong, fibrous tissues (ligaments) that connect your neck bones stretch or tear. Cervical sprains can range from mild to severe. Severe cervical sprains can cause the neck vertebrae to be unstable. This can lead to damage of the spinal cord and can result in serious nervous system problems. The amount of time it takes for a cervical sprain to get better depends on the cause and extent of the injury. Most cervical sprains heal in 1 to 3 weeks. CAUSES  Severe cervical sprains may be caused by:   Contact sport injuries (such as from football, rugby, wrestling, hockey, auto racing, gymnastics, diving, martial arts, or boxing).   Motor vehicle collisions.   Whiplash injuries. This is an injury from a sudden forward and backward whipping movement of the head and neck.  Falls.  Mild cervical sprains may be caused by:   Being in an awkward position, such as while cradling a telephone between your ear and shoulder.   Sitting in a chair that does not offer proper support.   Working at a poorly Marketing executive station.   Looking up or down for long periods of time.  SYMPTOMS   Pain, soreness, stiffness, or a burning sensation in the front, back, or sides of the neck. This discomfort may develop immediately after the injury or slowly, 24 hours or more after the injury.   Pain or tenderness directly in the middle of the back of the neck.   Shoulder or upper back pain.   Limited ability to move the neck.   Headache.   Dizziness.   Weakness,  numbness, or tingling in the hands or arms.   Muscle spasms.   Difficulty swallowing or chewing.   Tenderness and swelling of the neck.  DIAGNOSIS  Most of the time your health care provider can diagnose a cervical sprain by taking your history and doing a physical exam. Your health care provider will ask about previous neck injuries and any known neck problems, such as arthritis in the neck. X-rays may be taken to find out if there are any other problems, such as with the bones of the neck. Other tests, such as a CT scan or MRI, may also be needed.  TREATMENT  Treatment depends on the severity of the cervical sprain. Mild sprains can be treated with rest, keeping the neck in place (immobilization), and pain medicines. Severe cervical sprains are immediately immobilized. Further treatment is done to help with pain, muscle spasms, and other symptoms and may include:  Medicines, such as pain relievers, numbing medicines, or muscle relaxants.   Physical therapy. This may involve stretching exercises, strengthening exercises, and posture training. Exercises and improved posture can help stabilize the neck, strengthen muscles, and help stop symptoms from returning.  HOME CARE INSTRUCTIONS   Put ice on the injured area.   Put ice in a plastic bag.   Place a towel between your skin and the bag.   Leave the ice on for 15-20 minutes, 3-4 times a day.   If your injury was severe, you  may have been given a cervical collar to wear. A cervical collar is a two-piece collar designed to keep your neck from moving while it heals.  Do not remove the collar unless instructed by your health care provider.  If you have long hair, keep it outside of the collar.  Ask your health care provider before making any adjustments to your collar. Minor adjustments may be required over time to improve comfort and reduce pressure on your chin or on the back of your head.  Ifyou are allowed to remove the  collar for cleaning or bathing, follow your health care provider's instructions on how to do so safely.  Keep your collar clean by wiping it with mild soap and water and drying it completely. If the collar you have been given includes removable pads, remove them every 1-2 days and hand wash them with soap and water. Allow them to air dry. They should be completely dry before you wear them in the collar.  If you are allowed to remove the collar for cleaning and bathing, wash and dry the skin of your neck. Check your skin for irritation or sores. If you see any, tell your health care provider.  Do not drive while wearing the collar.   Only take over-the-counter or prescription medicines for pain, discomfort, or fever as directed by your health care provider.   Keep all follow-up appointments as directed by your health care provider.   Keep all physical therapy appointments as directed by your health care provider.   Make any needed adjustments to your workstation to promote good posture.   Avoid positions and activities that make your symptoms worse.   Warm up and stretch before being active to help prevent problems.  SEEK MEDICAL CARE IF:   Your pain is not controlled with medicine.   You are unable to decrease your pain medicine over time as planned.   Your activity level is not improving as expected.  SEEK IMMEDIATE MEDICAL CARE IF:   You develop any bleeding.  You develop stomach upset.  You have signs of an allergic reaction to your medicine.   Your symptoms get worse.   You develop new, unexplained symptoms.   You have numbness, tingling, weakness, or paralysis in any part of your body.  MAKE SURE YOU:   Understand these instructions.  Will watch your condition.  Will get help right away if you are not doing well or get worse.   This information is not intended to replace advice given to you by your health care provider. Make sure you discuss any  questions you have with your health care provider.   Document Released: 04/03/2007 Document Revised: 06/11/2013 Document Reviewed: 12/12/2012 Elsevier Interactive Patient Education [employer]2016 [employer]
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
## 2015-05-04 ENCOUNTER — Emergency Department (HOSPITAL_BASED_OUTPATIENT_CLINIC_OR_DEPARTMENT_OTHER)
Admission: EM | Admit: 2015-05-04 | Discharge: 2015-05-04 | Disposition: A | Discharge status: Home / Self Care | Payer: No Typology Code available for payment source | Attending: Emergency Medicine | Admitting: Emergency Medicine

## 2015-05-04 ENCOUNTER — Encounter (HOSPITAL_BASED_OUTPATIENT_CLINIC_OR_DEPARTMENT_OTHER): Payer: Self-pay

## 2015-05-04 ENCOUNTER — Emergency Department (HOSPITAL_BASED_OUTPATIENT_CLINIC_OR_DEPARTMENT_OTHER): Payer: No Typology Code available for payment source

## 2015-05-04 DIAGNOSIS — I10 Essential (primary) hypertension: Secondary | ICD-10-CM | POA: Diagnosis not present

## 2015-05-04 DIAGNOSIS — Z7982 Long term (current) use of aspirin: Secondary | ICD-10-CM | POA: Diagnosis not present

## 2015-05-04 DIAGNOSIS — S3992XA Unspecified injury of lower back, initial encounter: Secondary | ICD-10-CM | POA: Diagnosis present

## 2015-05-04 DIAGNOSIS — Z79899 Other long term (current) drug therapy: Secondary | ICD-10-CM | POA: Diagnosis not present

## 2015-05-04 DIAGNOSIS — S39012A Strain of muscle, fascia and tendon of lower back, initial encounter: Secondary | ICD-10-CM

## 2015-05-04 DIAGNOSIS — Y9389 Activity, other specified: Secondary | ICD-10-CM | POA: Insufficient documentation

## 2015-05-04 DIAGNOSIS — Z794 Long term (current) use of insulin: Secondary | ICD-10-CM | POA: Insufficient documentation

## 2015-05-04 DIAGNOSIS — Z7984 Long term (current) use of oral hypoglycemic drugs: Secondary | ICD-10-CM | POA: Diagnosis not present

## 2015-05-04 DIAGNOSIS — Y92410 Unspecified street and highway as the place of occurrence of the external cause: Secondary | ICD-10-CM | POA: Insufficient documentation

## 2015-05-04 DIAGNOSIS — Y998 Other external cause status: Secondary | ICD-10-CM | POA: Insufficient documentation

## 2015-05-04 DIAGNOSIS — F17210 Nicotine dependence, cigarettes, uncomplicated: Secondary | ICD-10-CM | POA: Diagnosis not present

## 2015-05-04 DIAGNOSIS — E119 Type 2 diabetes mellitus without complications: Secondary | ICD-10-CM | POA: Diagnosis not present

## 2015-05-04 MED ORDER — NAPROXEN 500 MG PO TABS: 500.0000 mg | ORAL_TABLET | Freq: Two times a day (BID) | ORAL | Status: AC

## 2015-05-04 NOTE — Discharge Instructions (Signed)
X-rays of the lower part of the back without any acute abnormalities. Take the Naprosyn as directed. Work note provided. Follow-up with your doctor if not improving over the next few days. Return for any new or worse symptoms.
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
## 2015-06-15 ENCOUNTER — Emergency Department (HOSPITAL_BASED_OUTPATIENT_CLINIC_OR_DEPARTMENT_OTHER): Payer: Commercial Managed Care - PPO

## 2015-06-15 ENCOUNTER — Encounter (HOSPITAL_BASED_OUTPATIENT_CLINIC_OR_DEPARTMENT_OTHER): Payer: Self-pay | Admitting: Emergency Medicine

## 2015-06-15 ENCOUNTER — Emergency Department (HOSPITAL_BASED_OUTPATIENT_CLINIC_OR_DEPARTMENT_OTHER)
Admission: EM | Admit: 2015-06-15 | Discharge: 2015-06-15 | Disposition: A | Discharge status: Home / Self Care | Payer: Commercial Managed Care - PPO | Attending: Emergency Medicine | Admitting: Emergency Medicine

## 2015-06-15 DIAGNOSIS — F17210 Nicotine dependence, cigarettes, uncomplicated: Secondary | ICD-10-CM | POA: Diagnosis not present

## 2015-06-15 DIAGNOSIS — Z7982 Long term (current) use of aspirin: Secondary | ICD-10-CM | POA: Diagnosis not present

## 2015-06-15 DIAGNOSIS — I10 Essential (primary) hypertension: Secondary | ICD-10-CM | POA: Insufficient documentation

## 2015-06-15 DIAGNOSIS — E1140 Type 2 diabetes mellitus with diabetic neuropathy, unspecified: Secondary | ICD-10-CM | POA: Insufficient documentation

## 2015-06-15 DIAGNOSIS — J019 Acute sinusitis, unspecified: Secondary | ICD-10-CM | POA: Diagnosis not present

## 2015-06-15 DIAGNOSIS — Z794 Long term (current) use of insulin: Secondary | ICD-10-CM | POA: Insufficient documentation

## 2015-06-15 DIAGNOSIS — Z791 Long term (current) use of non-steroidal anti-inflammatories (NSAID): Secondary | ICD-10-CM | POA: Diagnosis not present

## 2015-06-15 DIAGNOSIS — Z79899 Other long term (current) drug therapy: Secondary | ICD-10-CM | POA: Diagnosis not present

## 2015-06-15 DIAGNOSIS — Z7984 Long term (current) use of oral hypoglycemic drugs: Secondary | ICD-10-CM | POA: Diagnosis not present

## 2015-06-15 DIAGNOSIS — R51 Headache: Secondary | ICD-10-CM | POA: Diagnosis present

## 2015-06-15 HISTORY — DX: Type 2 diabetes mellitus with diabetic neuropathy, unspecified: E11.40

## 2015-06-15 MED ORDER — LORATADINE 10 MG PO TABS: 10.0000 mg | ORAL_TABLET | Freq: Every day | ORAL | Status: AC

## 2015-06-15 MED ORDER — AMOXICILLIN-POT CLAVULANATE 875-125 MG PO TABS: 1.0000 | ORAL_TABLET | Freq: Two times a day (BID) | ORAL | Status: AC

## 2015-06-15 NOTE — ED Provider Notes (Signed)
CSN: [id number]     Arrival date & time 06/15/15  0813 History   First MD Initiated Contact with Patient 06/15/15 [phone]     Chief Complaint  Patient presents with  . Facial Pain     (Consider location/radiation/quality/duration/timing/severity/associated sxs/prior Treatment) HPI   Larry Mercer is a 39 y.o. male with PMH significant for DM, HTN, diabetic neuropathy who presents with 4-5 day history of constant, moderate, worsening sinus pressure/facial pain unrelieved by OTC medications.  No aggravating factors.  Associated symptoms include generalized headache, nasal congestion, cough, and bilateral ear pressure/fullness.  Denies fever, myalgias, neck stiffness, neck pain, visual disturbances, sore throat, CP, SOB, N/V, or abdominal pain.  Endorses sick contacts, god-daughter PNA last week.   Past Medical History  Diagnosis Date  . Diabetes mellitus   . Hypertension   . Diabetic neuropathy ([place])    No past surgical history on file. No family history on file. Social History  Substance Use Topics  . Smoking status: Current Every Day Smoker -- 1.00 packs/day    Types: Cigarettes  . Smokeless tobacco: None  . Alcohol Use: Yes     Comment: occasional    Review of Systems  All other systems negative unless otherwise stated in HPI   Allergies  Review of patient's allergies indicates no known allergies.  Home Medications   Prior to Admission medications   Medication Sig Start Date End Date Taking? Authorizing Provider  amLODipine (NORVASC) 10 MG tablet Take 1 tablet (10 mg total) by mouth daily. 05/01/15   Nicole Pisciotta, PA-C  amoxicillin-clavulanate (AUGMENTIN) 875-125 MG tablet Take 1 tablet by mouth 2 (two) times daily. 06/15/15   Gloriann Loan, PA-C  aspirin 81 MG tablet Take 1 tablet (81 mg total) by mouth daily. 07/23/13   Reyne Dumas, MD  gabapentin (NEURONTIN) 300 MG capsule TAKE 1 CAPSULE BY MOUTH 2 TIMES DAILY. 05/08/14   Lance Bosch, NP  glucose blood test strip  Use as instructed 07/23/13   Reyne Dumas, MD  glucose monitoring kit (FREESTYLE) monitoring kit 1 each by Does not apply route as needed for other. Check CBG 3 times a day 07/23/13   Reyne Dumas, MD  HYDROcodone-acetaminophen (NORCO/VICODIN) 5-325 MG tablet Take 1-2 tablets by mouth every 6 hours as needed for pain and/or cough. 05/01/15   Nicole Pisciotta, PA-C  Insulin Glargine (TOUJEO SOLOSTAR) 300 UNIT/ML SOPN Inject 15 Units into the skin daily at 10 pm. 03/19/14   Lance Bosch, NP  Insulin Pen Needle (1ST CHOICE PEN NEEDLES) 31G X 8 MM MISC Use new pen needle with each injection-Lantus and Novolog pen. 07/23/13   Reyne Dumas, MD  Lancets MISC For TID CBG testing 06/19/12   Acquanetta Chain, DO  lisinopril (PRINIVIL,ZESTRIL) 10 MG tablet Take 1 tablet (10 mg total) by mouth daily. 07/23/13   Reyne Dumas, MD  loratadine (CLARITIN) 10 MG tablet Take 1 tablet (10 mg total) by mouth daily. 06/15/15   Gloriann Loan, PA-C  metFORMIN (GLUCOPHAGE) 500 MG tablet Take 1 tablet (500 mg total) by mouth 2 (two) times daily with a meal. 12/16/14   Lance Bosch, NP  naproxen (NAPROSYN) 500 MG tablet Take 1 tablet (500 mg total) by mouth 2 (two) times daily. 05/04/15   Fredia Sorrow, MD  Vitamin D, Ergocalciferol, (DRISDOL) 50000 UNITS CAPS capsule Take 1 capsule (50,000 Units total) by mouth every 7 (seven) days. 07/25/13   Reyne Dumas, MD   BP 130/85 mmHg  Pulse 82  Temp(Src) 98.1  F (36.7 C) (Oral)  Resp 18  Ht '5\' 9"'$  (1.753 m)  Wt 97.523 kg  BMI 31.74 kg/m2  SpO2 100% Physical Exam  Constitutional: He is oriented to person, place, and time. He appears well-developed and well-nourished.  HENT:  Head: Normocephalic and atraumatic.  Right Ear: Tympanic membrane and external ear normal. No drainage. Tympanic membrane is not injected, not erythematous and not bulging. No middle ear effusion.  Left Ear: Tympanic membrane and external ear normal. No drainage. Tympanic membrane is not injected, not  erythematous and not bulging.  No middle ear effusion.  Nose: Mucosal edema present. No epistaxis. Right sinus exhibits maxillary sinus tenderness and frontal sinus tenderness. Left sinus exhibits maxillary sinus tenderness and frontal sinus tenderness.  Mouth/Throat: Uvula is midline, oropharynx is clear and moist and mucous membranes are normal. No trismus in the jaw. No uvula swelling. No oropharyngeal exudate, posterior oropharyngeal edema, posterior oropharyngeal erythema or tonsillar abscesses.  Eyes: Conjunctivae and lids are normal. Pupils are equal, round, and reactive to light.  Neck: Normal range of motion. Neck supple.  Cardiovascular: Normal rate, regular rhythm and normal heart sounds.   No murmur heard. Pulmonary/Chest: Effort normal and breath sounds normal. No accessory muscle usage or stridor. No respiratory distress. He has no wheezes. He has no rhonchi. He has no rales.  Abdominal: Soft. Bowel sounds are normal. He exhibits no distension. There is no tenderness.  Musculoskeletal: Normal range of motion.  Lymphadenopathy:    He has no cervical adenopathy.  Neurological: He is alert and oriented to person, place, and time.  Speech clear without dysarthria.  Skin: Skin is warm and dry.  Psychiatric: He has a normal mood and affect. His behavior is normal.    ED Course  Procedures (including critical care time) Labs Review Labs Reviewed - No data to display  Imaging Review Dg Chest 2 View  06/15/2015  CLINICAL DATA:  Sinus pressure, runny nose for 5 days EXAM: CHEST  2 VIEW COMPARISON:  July 04, 2005 FINDINGS: The heart size and mediastinal contours are within normal limits. There is no focal infiltrate, pulmonary edema, or pleural effusion. The visualized skeletal structures are stable. Degenerative joint changes of the spine are noted. IMPRESSION: No active cardiopulmonary disease. Electronically Signed   By: Abelardo Diesel M.D.   On: 06/15/2015 09:32   I have  personally reviewed and evaluated these images and lab results as part of my medical decision-making.   EKG Interpretation None      MDM   Final diagnoses:  Acute sinusitis, recurrence not specified, unspecified location    Patient presents with sinus pressure/facial pain that began about 4-5 days ago along with nasal congestion and cough.  No fever, neck stiffness, visual disturbances, CP, or SOB.  VSS, NAD.  On exam, TMs clear bilaterally, uvula midline, frontal and maxillary sinus tenderness.  No eyelid swelling.  Heart RRR, lungs CTAB, abdomen soft and benign.  CXR negative.  High suspicion for sinusitis.  Doubt meningitis.  Doubt pharyngitis.  Doubt PNA.  Will d/c home with Augmentin and claritin. Evaluation does not show pathology requring ongoing emergent intervention or admission. Pt is hemodynamically stable and mentating appropriately. Discussed findings/results and plan with patient/guardian, who agrees with plan. All questions answered. Return precautions discussed and outpatient follow up given.      Gloriann Loan, PA-C 06/15/15 1000  Leo Grosser, MD 06/15/15 [phone]

## 2015-06-15 NOTE — ED Notes (Signed)
Sinus pressure, runny nose, yellow green sputum from nose for 4-5 days.  No fever.

## 2015-06-15 NOTE — Discharge Instructions (Signed)

## 2015-06-17 ENCOUNTER — Encounter: Payer: Self-pay | Admitting: Podiatry

## 2015-06-17 ENCOUNTER — Ambulatory Visit (INDEPENDENT_AMBULATORY_CARE_PROVIDER_SITE_OTHER): Payer: Commercial Managed Care - PPO | Admitting: Podiatry

## 2015-06-17 VITALS — BP 135/98 | HR 69 | Resp 12

## 2015-06-17 DIAGNOSIS — E119 Type 2 diabetes mellitus without complications: Secondary | ICD-10-CM

## 2015-06-17 NOTE — Patient Instructions (Signed)
Today year diabetic foot examination demonstrated adequate circulation and feeling in your feet. Your symptoms of occasional sharp pain may be possibly related to diabetes, however, I'm not recommending any active treatment Okay to use a pumice stone or callus file to [facility] calluses on the size your big toe Recommending you stop smoking Return yearly or as needed  Diabetes and Foot Care Diabetes may cause you to have problems because of poor blood supply (circulation) to your feet and legs. This may cause the skin on your feet to become thinner, break easier, and heal more slowly. Your skin may become dry, and the skin may peel and crack. You may also have nerve damage in your legs and feet causing decreased feeling in them. You may not notice minor injuries to your feet that could lead to infections or more serious problems. Taking care of your feet is one of the most important things you can do for yourself.  HOME CARE INSTRUCTIONS  Wear shoes at all times, even in the house. Do not go barefoot. Bare feet are easily injured.  Check your feet daily for blisters, cuts, and redness. If you cannot see the bottom of your feet, use a mirror or ask someone for help.  Wash your feet with warm water (do not use hot water) and mild soap. Then pat your feet and the areas between your toes until they are completely dry. Do not soak your feet as this can dry your skin.  Apply a moisturizing lotion or petroleum jelly (that does not contain alcohol and is unscented) to the skin on your feet and to dry, brittle toenails. Do not apply lotion between your toes.  Trim your toenails straight across. Do not dig under them or around the cuticle. File the edges of your nails with an emery board or nail file.  Do not cut corns or calluses or try to remove them with medicine.  Wear clean socks or stockings every day. Make sure they are not too tight. Do not wear knee-high stockings since they may decrease blood  flow to your legs.  Wear shoes that fit properly and have enough cushioning. To break in new shoes, wear them for just a few hours a day. This prevents you from injuring your feet. Always look in your shoes before you put them on to be sure there are no objects inside.  Do not cross your legs. This may decrease the blood flow to your feet.  If you find a minor scrape, cut, or break in the skin on your feet, keep it and the skin around it clean and dry. These areas may be cleansed with mild soap and water. Do not cleanse the area with peroxide, alcohol, or iodine.  When you remove an adhesive bandage, be sure not to damage the skin around it.  If you have a wound, look at it several times a day to make sure it is healing.  Do not use heating pads or hot water bottles. They may burn your skin. If you have lost feeling in your feet or legs, you may not know it is happening until it is too late.  Make sure your health care provider performs a complete foot exam at least annually or more often if you have foot problems. Report any cuts, sores, or bruises to your health care provider immediately. SEEK MEDICAL CARE IF:   You have an injury that is not healing.  You have cuts or breaks in the skin.  You have an ingrown nail.  You notice redness on your legs or feet.  You feel burning or tingling in your legs or feet.  You have pain or cramps in your legs and feet.  Your legs or feet are numb.  Your feet always feel cold. SEEK IMMEDIATE MEDICAL CARE IF:   There is increasing redness, swelling, or pain in or around a wound.  There is a red line that goes up your leg.  Pus is coming from a wound.  You develop a fever or as directed by your health care provider.  You notice a bad smell coming from an ulcer or wound.   This information is not intended to replace advice given to you by your health care provider. Make sure you discuss any questions you have with your health care  provider.   Document Released: 06/03/2000 Document Revised: 02/06/2013 Document Reviewed: 11/13/2012 Elsevier Interactive Patient Education [employer].

## 2015-06-17 NOTE — Progress Notes (Signed)
   Subjective:    Patient ID: Larry Mercer, male    DOB: 07/19/1975, 39 y.o.   MRN: [id number]  HPI   As patient presents today requesting a diabetic foot examination. Also, patient relates a 2 year history of intermittent sharp pains in the right and left feet that occur several times a day lasting several minutes and are self-limiting. 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 Patient said that he took gabapentin 300 mg once daily for a year, however, discontinued the medication because it caused muscular spasms. He said that the occasional sharp pain still persisted with the gabapentin.  Patient denies any history of foot ulceration, claudication or amputation  Review of Systems  Constitutional: Positive for unexpected weight change.  Musculoskeletal: Positive for gait problem.       Objective:   Physical Exam  Orientated 3  Vascular: No peripheral edema bilaterally DP and PT pulses 2/4 bilaterally Capillary reflex media bilaterally  Neurological: Sensation to 10 g monofilament wire intact 5/5 bilaterally Vibratory sensation reactive bilaterally Ankle reflex equal and reactive bilaterally  Dermatological: No open skin lesions bilaterally Callusing medial hallux bilaterally Skin texture within normal limits, however dry bilaterally Nails are neatly trimmed and have occasional texture and color changes 6-10  Musculoskeletal: There is no restriction ankle, subtalar, midtarsal joints bilaterally No deformities noted bilaterally      Assessment & Plan:   Assessment: Satisfactory neurovascular status Diabetic without complications Occasional episodes of sharp pain do not prevent patient from doing daily activities or sleeping and I am not recommending any active treatment at this time  Plan: I reviewed the results of the examination with patient today I made  aware he had satisfactory neurovascular status. I recommended that he use a pumice stone or callus file to send calluses on his hallux as needed I recommended that he stop smoking  Reappoint as needed or yearly

## 2016-09-08 ENCOUNTER — Emergency Department (HOSPITAL_BASED_OUTPATIENT_CLINIC_OR_DEPARTMENT_OTHER)
Admission: EM | Admit: 2016-09-08 | Discharge: 2016-09-08 | Disposition: A | Discharge status: Home / Self Care | Payer: Commercial Managed Care - PPO | Attending: Emergency Medicine | Admitting: Emergency Medicine

## 2016-09-08 ENCOUNTER — Encounter (HOSPITAL_BASED_OUTPATIENT_CLINIC_OR_DEPARTMENT_OTHER): Payer: Self-pay | Admitting: *Deleted

## 2016-09-08 DIAGNOSIS — H1013 Acute atopic conjunctivitis, bilateral: Secondary | ICD-10-CM

## 2016-09-08 DIAGNOSIS — I10 Essential (primary) hypertension: Secondary | ICD-10-CM | POA: Insufficient documentation

## 2016-09-08 DIAGNOSIS — F17210 Nicotine dependence, cigarettes, uncomplicated: Secondary | ICD-10-CM | POA: Insufficient documentation

## 2016-09-08 DIAGNOSIS — H1012 Acute atopic conjunctivitis, left eye: Secondary | ICD-10-CM | POA: Insufficient documentation

## 2016-09-08 DIAGNOSIS — H1011 Acute atopic conjunctivitis, right eye: Secondary | ICD-10-CM | POA: Insufficient documentation

## 2016-09-08 DIAGNOSIS — Z794 Long term (current) use of insulin: Secondary | ICD-10-CM | POA: Insufficient documentation

## 2016-09-08 DIAGNOSIS — Z7982 Long term (current) use of aspirin: Secondary | ICD-10-CM | POA: Insufficient documentation

## 2016-09-08 DIAGNOSIS — E119 Type 2 diabetes mellitus without complications: Secondary | ICD-10-CM | POA: Insufficient documentation

## 2016-09-08 DIAGNOSIS — Z79899 Other long term (current) drug therapy: Secondary | ICD-10-CM | POA: Insufficient documentation

## 2016-09-08 MED ORDER — NAPHAZOLINE-PHENIRAMINE 0.025-0.3 % OP SOLN: 1.0000 [drp] | OPHTHALMIC | 0 refills | Status: AC | PRN

## 2016-09-08 NOTE — ED Notes (Signed)
Pt directed to pharmacy to pick up Rx. Work note given 

## 2016-09-08 NOTE — Discharge Instructions (Signed)
We think your eye irritation is caused by an allergic reaction. 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 You might want to consider using protective eyewear.   Use prescribed eye drops as recommended. Also take over the counter Zyrtec as directed to help with the symptoms.   We have provided you the number of an eye doctor. Please call his office and arrange to be seen in 2 days if your symptoms are not improving or worsening.   Return to the emergency department immediately if you have any:  - Change in vision  - Pain with moving your eyes  - Increased swelling around your eyes  - Worsening redness or soreness in eyes

## 2016-09-08 NOTE — ED Provider Notes (Signed)
[facility] DEPT [facility] Provider Note   CSN: [id number] Arrival date & time: 09/08/16  1053     History   Chief Complaint Chief Complaint  Patient presents with  . Eye Problem    HPI Larry Mercer is a 41 y.o. male who presents with 2 weeks of intermittent, bilateral eye redness, soreness and itching. When symptoms initially presented, he used OTC eye drops which provided temporary relief. Patient reports that symptoms worsened yesterday and were not improved with eyedrop use. He reports this morning he noted some crusting and mucous like discharge from bilateral eyes. He denies any vision changes. Patient does not wear contacts or glasses. 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 He does not wear any eye protection during work. He has worked there for 2 years and not had any issues, but does report that they recently changed linen companies about a month ago. He denies any fevers/chills, sore throat, urinary complaints.   The history is provided by the patient.    Past Medical History:  Diagnosis Date  . Diabetes mellitus   . Diabetic neuropathy ([place])   . Hypertension     Patient Active Problem List   Diagnosis Date Noted  . DM (diabetes mellitus) ([place]) 06/19/2012  . HTN (hypertension) 06/19/2012  . Peripheral neuropathy ([place]) 06/19/2012    History reviewed. No pertinent surgical history.     Home Medications    Prior to Admission medications   Medication Sig Start Date End Date Taking? Authorizing Provider  amLODipine (NORVASC) 10 MG tablet Take 1 tablet (10 mg total) by mouth daily. 05/01/15  Yes Nicole Pisciotta, PA-C  aspirin 81 MG tablet Take 1 tablet (81 mg total) by mouth daily. 07/23/13  Yes Reyne Dumas, MD  insulin aspart (NOVOLOG) 100 UNIT/ML injection Inject into the skin 3 (three) times daily before meals.   Yes Historical Provider, MD  lisinopril (PRINIVIL,ZESTRIL) 10 MG tablet Take 1 tablet (10 mg total) by  mouth daily. 07/23/13  Yes Reyne Dumas, MD  loratadine (CLARITIN) 10 MG tablet Take 1 tablet (10 mg total) by mouth daily. 06/15/15  Yes Gloriann Loan, PA-C  metFORMIN (GLUCOPHAGE) 500 MG tablet Take 1 tablet (500 mg total) by mouth 2 (two) times daily with a meal. 12/16/14  Yes Lance Bosch, NP  amoxicillin-clavulanate (AUGMENTIN) 875-125 MG tablet Take 1 tablet by mouth 2 (two) times daily. 06/15/15   Gloriann Loan, PA-C  gabapentin (NEURONTIN) 300 MG capsule TAKE 1 CAPSULE BY MOUTH 2 TIMES DAILY. 05/08/14   Lance Bosch, NP  glucose blood test strip Use as instructed 07/23/13   Reyne Dumas, MD  glucose monitoring kit (FREESTYLE) monitoring kit 1 each by Does not apply route as needed for other. Check CBG 3 times a day 07/23/13   Reyne Dumas, MD  HYDROcodone-acetaminophen (NORCO/VICODIN) 5-325 MG tablet Take 1-2 tablets by mouth every 6 hours as needed for pain and/or cough. 05/01/15   Nicole Pisciotta, PA-C  Insulin Glargine (TOUJEO SOLOSTAR) 300 UNIT/ML SOPN Inject 15 Units into the skin daily at 10 pm. 03/19/14   Lance Bosch, NP  Insulin Pen Needle (1ST CHOICE PEN NEEDLES) 31G X 8 MM MISC Use new pen needle with each injection-Lantus and Novolog pen. 07/23/13   Reyne Dumas, MD  Lancets MISC For TID CBG testing 06/19/12   Acquanetta Chain, DO  naphazoline-pheniramine (NAPHCON-A) 0.025-0.3 % ophthalmic solution Place 1 drop into both eyes every 4 (four) hours as needed for irritation.  09/08/16   Orson Aloe, PA  naproxen (NAPROSYN) 500 MG tablet Take 1 tablet (500 mg total) by mouth 2 (two) times daily. 05/04/15   Fredia Sorrow, MD  Vitamin D, Ergocalciferol, (DRISDOL) 50000 UNITS CAPS capsule Take 1 capsule (50,000 Units total) by mouth every 7 (seven) days. 07/25/13   Reyne Dumas, MD    Family History No family history on file.  Social History Social History  Substance Use Topics  . Smoking status: Current Every Day Smoker    Packs/day: 1.00    Types: Cigarettes  . Smokeless  tobacco: Never Used  . Alcohol use Yes     Comment: occasional     Allergies   Patient has no known allergies.   Review of Systems Review of Systems  Constitutional: Negative for chills and fever.  HENT: Negative for congestion, facial swelling, rhinorrhea and sore throat.   Eyes: Positive for discharge, redness and itching. Negative for visual disturbance.  Respiratory: Negative for shortness of breath.   Cardiovascular: Negative for chest pain.  Gastrointestinal: Negative for abdominal pain, diarrhea and nausea.  Genitourinary: Negative for dysuria and hematuria.  Neurological: Positive for headaches.  All other systems reviewed and are negative.    Physical Exam Updated Vital Signs BP 127/80 (BP Location: Right Arm)   Pulse 84   Temp 98.3 F (36.8 C) (Oral)   Resp 16   Ht _0  (1.753 m)   Wt 97.5 kg   SpO2 100%   BMI 31.75 kg/m   Physical Exam  Constitutional: He appears well-developed and well-nourished.  HENT:  Head: Normocephalic and atraumatic.  Nose: Right sinus exhibits no maxillary sinus tenderness and no frontal sinus tenderness. Left sinus exhibits no maxillary sinus tenderness and no frontal sinus tenderness.  Eyes: EOM are normal. Pupils are equal, round, and reactive to light. Lids are everted and swept, no foreign bodies found. Right eye exhibits no chemosis, no discharge and no hordeolum. Left eye exhibits no chemosis, no discharge and no hordeolum. Right conjunctiva is injected. Left conjunctiva is injected. No scleral icterus.  Mild periorbital edema to lower eyelid bilaterally. No warmth noted. No evidence of hordeolum or chalzion.  Pupils are small but reactive bilaterally   Cardiovascular: Regular rhythm and normal pulses.  Tachycardia present.   Pulmonary/Chest: Effort normal.  Musculoskeletal: He exhibits no deformity.  Neurological: He is alert.  Skin: Skin is warm and dry.  Psychiatric: He has a normal mood and affect. His speech is normal  and behavior is normal.     ED Treatments / Results  Labs (all labs ordered are listed, but only abnormal results are displayed) Labs Reviewed - No data to display  EKG  EKG Interpretation None       Radiology No results found.  Procedures Procedures (including critical care time)  Medications Ordered in ED Medications - No data to display   Initial Impression / Assessment and Plan / ED Course  I have reviewed the triage vital signs and the nursing notes.  Pertinent labs & imaging results that were available during my care of the patient were reviewed by me and considered in my medical decision making (see chart for details).    Patient with 2 weeks of intermittent eye redness, soreness and itching. Initially improved with OTC eye drops, but worsened yesterday. Visual acuity intact in ED today. No evidence of discharge or hordeolum on exam. Consider viral conjunctivitis vs allergic conjunctivitis. History and physical not consistent with periorbital cellulitis. Given physical exam  and that symptoms started bilaterally, low suspicion for bacterial conjunctivitis. Plan to discharge patient with antihistamine eye drops. Instructed patient to take OTC zyrtec to help with additional symptomatic relief. Referred patient to ophthalmology and instructed him to call their office and arrange a follow-up appointment if he does not experience any improvement with symptoms. Return precautions discussed. Patient expresses understanding and agreement to plan.     Final Clinical Impressions(s) / ED Diagnoses   Final diagnoses:  Allergic conjunctivitis of both eyes    New Prescriptions Discharge Medication List as of 09/08/2016 12:07 PM    START taking these medications   Details  naphazoline-pheniramine (NAPHCON-A) 0.025-0.3 % ophthalmic solution Place 1 drop into both eyes every 4 (four) hours as needed for irritation., Starting Thu 09/08/2016, [place],  [place] 09/08/16 2156    Blanchie Dessert, MD 09/09/16 2038

## 2016-09-08 NOTE — ED Triage Notes (Addendum)
Eyes are sore, itching, red and draining x 2 weeks. He felt a pop in the back of his left eye 2 weeks ago and this am he has felt a crunching behind his left eye. No visual changes.

## 2016-09-10 ENCOUNTER — Encounter (HOSPITAL_COMMUNITY): Payer: Self-pay | Admitting: Emergency Medicine

## 2016-09-10 ENCOUNTER — Emergency Department (HOSPITAL_COMMUNITY)
Admission: EM | Admit: 2016-09-10 | Discharge: 2016-09-10 | Disposition: A | Discharge status: Home / Self Care | Payer: Commercial Managed Care - PPO | Attending: Emergency Medicine | Admitting: Emergency Medicine

## 2016-09-10 DIAGNOSIS — F17210 Nicotine dependence, cigarettes, uncomplicated: Secondary | ICD-10-CM | POA: Insufficient documentation

## 2016-09-10 DIAGNOSIS — Z79899 Other long term (current) drug therapy: Secondary | ICD-10-CM | POA: Insufficient documentation

## 2016-09-10 DIAGNOSIS — I10 Essential (primary) hypertension: Secondary | ICD-10-CM | POA: Insufficient documentation

## 2016-09-10 DIAGNOSIS — Z7982 Long term (current) use of aspirin: Secondary | ICD-10-CM | POA: Insufficient documentation

## 2016-09-10 DIAGNOSIS — H1013 Acute atopic conjunctivitis, bilateral: Secondary | ICD-10-CM | POA: Insufficient documentation

## 2016-09-10 DIAGNOSIS — E1140 Type 2 diabetes mellitus with diabetic neuropathy, unspecified: Secondary | ICD-10-CM | POA: Insufficient documentation

## 2016-09-10 DIAGNOSIS — Z794 Long term (current) use of insulin: Secondary | ICD-10-CM | POA: Insufficient documentation

## 2016-09-10 LAB — CBG MONITORING, ED: GLUCOSE-CAPILLARY: 318 mg/dL — AB (ref 65–99)

## 2016-09-10 MED ORDER — PROPARACAINE HCL 0.5 % OP SOLN
2.0000 [drp] | Freq: Once | OPHTHALMIC | Status: AC
  Administered 2016-09-10: 2 [drp] via OPHTHALMIC
  Filled 2016-09-10: qty 15

## 2016-09-10 MED ORDER — FLUORESCEIN SODIUM 0.6 MG OP STRP
1.0000 | ORAL_STRIP | Freq: Once | OPHTHALMIC | Status: AC
  Administered 2016-09-10: 1 via OPHTHALMIC
  Filled 2016-09-10: qty 1

## 2016-09-10 NOTE — ED Triage Notes (Signed)
Area below both eyes is swollen. Both eyes are red.Pt reports no relief of pain and pressure in both eyes x 5 days. Seen and initiated treatment 2 days ago.. Drainage from eyes is clear

## 2016-09-10 NOTE — ED Triage Notes (Signed)
CBG 318 Pt has not tested his blood sugar today. Did not take his am insulin

## 2016-09-10 NOTE — Discharge Instructions (Signed)
Please continue to use Visine, cold compress to the area. Please use the Naphazoline prescribed to you at previous visit. Please take Zyrtec over the counter. Wear glasses at work. In please discontinue eating the daily fish. Please schedule appointment with Dr. Harvel QualeAbugo, Ophthalmologist, regarding today's visit. I would like you to follow up with your primary care provider and see an allergist regarding today's visit as well if symptoms continue.   Get help right away if: You have redness, swelling, or other symptoms in only one eye. Your vision is blurred or you have vision changes. You have severe eye pain.

## 2016-09-10 NOTE — ED Provider Notes (Signed)
[facility] DEPT Provider Note   CSN: [id number] Arrival date & time: 09/10/16  1038   By signing my name below, I, Soijett Blue, attest that this documentation has been prepared under the direction and in the presence of Heath Lark, PA-C Electronically Signed: [place], ED Scribe. 09/10/16. 11:47 AM.  History   Chief Complaint Chief Complaint  Patient presents with  . Eye Pain    HPI Larry Mercer is a 41 y.o. male with a PMHx of DM, HTN, who presents to the Emergency Department complaining of 8.5/10, gradually worsening, bilateral eye pain onset 4 days ago. Pt notes that he has bilateral eye pain due to pressure sensation caused by lower lid swelling. Pt reports associated bilateral eye itching, bilateral eye redness, and bilateral lower lid swelling. Pt has tried Rx naphcon-A drops with minimal relief and benadryl with no relief of his symptoms. Also admits to cold and warm compress with minimal relief. He states that he was seen in the ED two days ago for similar symptoms and treated with eye drops. He notes that he was referred to an ophthalmologist for his symptoms, to which he has yet to follow up. He notes that he has been working in a hotel for the past 2 years and they changed the linen prior to the onset of his symptoms. Pt also endorses that he has consumed fast-food style fish daily prior to the onset of his symptoms. Denies wearing contacts or glasses. Denies sick contacts with similar symptoms, recent medication change, seasonal allergies, or allergies to medications. He denies eye drainage, vision change, blurred vision, photophobia, fever, chills, nausea, vomiting, diarrhea, and any other symptoms. Pt states that he hasn't taken his HTN medications this morning and he does have a PCP.   Per pt chart review: Pt was seen in the ED on 09/08/2016 for bilateral eye irritation. Pt was Rx naphcon-A ophthalmic solution and advised use of benadryl for their symptoms.  The  history is provided by the patient. No language interpreter was used.    Past Medical History:  Diagnosis Date  . Diabetes mellitus   . Diabetic neuropathy ([place])   . Hypertension     Patient Active Problem List   Diagnosis Date Noted  . DM (diabetes mellitus) ([place]) 06/19/2012  . HTN (hypertension) 06/19/2012  . Peripheral neuropathy ([place]) 06/19/2012    Past Surgical History:  Procedure Laterality Date  . WISDOM TOOTH EXTRACTION         Home Medications    Prior to Admission medications   Medication Sig Start Date End Date Taking? Authorizing Provider  amLODipine (NORVASC) 10 MG tablet Take 1 tablet (10 mg total) by mouth daily. 05/01/15   Nicole Pisciotta, PA-C  amoxicillin-clavulanate (AUGMENTIN) 875-125 MG tablet Take 1 tablet by mouth 2 (two) times daily. 06/15/15   Gloriann Loan, PA-C  aspirin 81 MG tablet Take 1 tablet (81 mg total) by mouth daily. 07/23/13   Reyne Dumas, MD  gabapentin (NEURONTIN) 300 MG capsule TAKE 1 CAPSULE BY MOUTH 2 TIMES DAILY. 05/08/14   Lance Bosch, NP  glucose blood test strip Use as instructed 07/23/13   Reyne Dumas, MD  glucose monitoring kit (FREESTYLE) monitoring kit 1 each by Does not apply route as needed for other. Check CBG 3 times a day 07/23/13   Reyne Dumas, MD  HYDROcodone-acetaminophen (NORCO/VICODIN) 5-325 MG tablet Take 1-2 tablets by mouth every 6 hours as needed for pain and/or cough. 05/01/15   Nicole Pisciotta, PA-C  insulin aspart (  NOVOLOG) 100 UNIT/ML injection Inject into the skin 3 (three) times daily before meals.    Historical Provider, MD  Insulin Glargine (TOUJEO SOLOSTAR) 300 UNIT/ML SOPN Inject 15 Units into the skin daily at 10 pm. 03/19/14   Lance Bosch, NP  Insulin Pen Needle (1ST CHOICE PEN NEEDLES) 31G X 8 MM MISC Use new pen needle with each injection-Lantus and Novolog pen. 07/23/13   Reyne Dumas, MD  Lancets MISC For TID CBG testing 06/19/12   Acquanetta Chain, DO  lisinopril (PRINIVIL,ZESTRIL) 10 MG tablet  Take 1 tablet (10 mg total) by mouth daily. 07/23/13   Reyne Dumas, MD  loratadine (CLARITIN) 10 MG tablet Take 1 tablet (10 mg total) by mouth daily. 06/15/15   Gloriann Loan, PA-C  metFORMIN (GLUCOPHAGE) 500 MG tablet Take 1 tablet (500 mg total) by mouth 2 (two) times daily with a meal. 12/16/14   Lance Bosch, NP  naphazoline-pheniramine (NAPHCON-A) 0.025-0.3 % ophthalmic solution Place 1 drop into both eyes every 4 (four) hours as needed for irritation. 09/08/16   Orson Aloe, PA  naproxen (NAPROSYN) 500 MG tablet Take 1 tablet (500 mg total) by mouth 2 (two) times daily. 05/04/15   Fredia Sorrow, MD  Vitamin D, Ergocalciferol, (DRISDOL) 50000 UNITS CAPS capsule Take 1 capsule (50,000 Units total) by mouth every 7 (seven) days. 07/25/13   Reyne Dumas, MD    Family History Family History  Problem Relation Age of Onset  . Diabetes Mother   . Hypertension Mother     Social History Social History  Substance Use Topics  . Smoking status: Current Every Day Smoker    Packs/day: 1.00    Types: Cigarettes  . Smokeless tobacco: Never Used  . Alcohol use Yes     Comment: occasional     Allergies   Patient has no known allergies.   Review of Systems Review of Systems  Constitutional: Negative for chills and fever.  Eyes: Positive for pain, redness and itching (bilateral). Negative for photophobia, discharge and visual disturbance.       +bilateral lower lid swelling  Gastrointestinal: Negative for diarrhea, nausea and vomiting.     Physical Exam Updated Vital Signs BP (!) 165/110 (BP Location: Left Arm)   Pulse 98   Temp 97.8 F (36.6 C) (Oral)   Resp 18   Wt 97.5 kg   SpO2 100%   BMI 31.75 kg/m   Physical Exam  Constitutional: He appears well-developed and well-nourished.  Well appearing  HENT:  Head: Normocephalic and atraumatic.  Nose: Nose normal.  Mouth/Throat: Uvula is midline, oropharynx is clear and moist and mucous membranes are normal.  Eyes: EOM are  normal. Pupils are equal, round, and reactive to light. Lids are everted and swept, no foreign bodies found. No foreign body present in the right eye. No foreign body present in the left eye. Right conjunctiva is injected. Left conjunctiva is injected.  Slit lamp exam:      The right eye shows no corneal abrasion, no corneal ulcer, no foreign body and no fluorescein uptake.       The left eye shows no corneal abrasion, no corneal ulcer, no foreign body and no fluorescein uptake.  Prominent redness and swelling to lower eyelids. Conjunctiva mildly injected bilaterally. Tono-pen pressure 10 on right and 14 on left. No fluorescein uptake noted. No foreign bodies noted on inspection and  eversion of eye lid  Neck: Normal range of motion.  Cardiovascular: Normal rate, regular rhythm and  normal heart sounds.  Exam reveals no gallop and no friction rub.   No murmur heard. Pulmonary/Chest: Effort normal and breath sounds normal. No respiratory distress. He has no wheezes. He has no rales.  Normal work of breathing. No respiratory distress noted.   Abdominal: Soft.  Musculoskeletal: Normal range of motion.  Neurological: He is alert.  Cranial Nerves:  III,IV, VI: ptosis not present, extra-ocular movements intact bilaterally, direct and consensual pupillary light reflexes intact bilaterally V: facial sensation, jaw opening, and bite strength equal bilaterally VII: eyebrow raise, eyelid close, smile, frown, pucker equal bilaterally VIII: hearing grossly normal bilaterally  IX,X: palate elevation and swallowing intact XI: bilateral shoulder shrug and lateral head rotation equal and strong XII: midline tongue extension  Negative pronator drift, negative Romberg, negative RAM's, negative heel-to-shin, negative finger to nose.    Sensory intact.  Muscle strength 5/5 Patient able to ambulate without difficulty.   Skin: Skin is warm.  Psychiatric: He has a normal mood and affect. His behavior is normal.    Nursing note and vitals reviewed.    ED Treatments / Results  DIAGNOSTIC STUDIES: Oxygen Saturation is 96% on RA, nl by my interpretation.    COORDINATION OF CARE: 11:32 AM Discussed treatment plan with pt at bedside which includes CBG, consult with attending, and pt agreed to plan.   Labs (all labs ordered are listed, but only abnormal results are displayed) Labs Reviewed  CBG MONITORING, ED - Abnormal; Notable for the following:       Result Value   Glucose-Capillary 318 (*)    All other components within normal limits    EKG  EKG Interpretation None       Radiology No results found.  Procedures Procedures (including critical care time)  Medications Ordered in ED Medications  fluorescein ophthalmic strip 1 strip (1 strip Both Eyes Given 09/10/16 1217)  proparacaine (ALCAINE) 0.5 % ophthalmic solution 2 drop (2 drops Both Eyes Given 09/10/16 1217)     Initial Impression / Assessment and Plan / ED Course  I have reviewed the triage vital signs and the nursing notes.  Pertinent labs & imaging results that were available during my care of the patient were reviewed by me and considered in my medical decision making (see chart for details).     Patient presentation consistent with allergic conjunctivitis.  No purulent discharge, corneal abrasions, entrapment, consensual photophobia, or dendritic staining with fluorescein study.  Visual acuity intact. Presentation non-concerning for iritis, bacterial conjunctivitis, corneal abrasions, or HSV.  No antibiotics are indicated and patient was already prescribed naphazoline for itching.  Personal hygiene and frequent handwashing discussed.  Patient advised to followup with ophthalmologist on Monday regarding today's visit, especially for new symptoms such as vision change or purulent discharge. Patient also given instruction to follow up with primary care provider and possibly allergist dependent on ophthalmology evaluation.  Patient verbalizes understanding and is agreeable with discharge. Reasons to immediately return to the ED discussed.   Final Clinical Impressions(s) / ED Diagnoses   Final diagnoses:  Allergic conjunctivitis of both eyes    New Prescriptions Discharge Medication List as of 09/10/2016  1:21 PM     I personally performed the services described in this documentation, which was scribed in my presence. The recorded information has been reviewed and is accurate.    [place], [place] 09/10/16 1341    Varney Biles, MD 09/11/16 1019

## 2016-09-10 NOTE — ED Notes (Signed)
ED Provider at bedside. 

## 2016-09-12 ENCOUNTER — Encounter (HOSPITAL_COMMUNITY): Payer: Self-pay | Admitting: Emergency Medicine

## 2016-09-12 ENCOUNTER — Emergency Department (HOSPITAL_COMMUNITY)
Admission: EM | Admit: 2016-09-12 | Discharge: 2016-09-12 | Disposition: A | Discharge status: Home / Self Care | Payer: Commercial Managed Care - PPO | Attending: Emergency Medicine | Admitting: Emergency Medicine

## 2016-09-12 DIAGNOSIS — H1033 Unspecified acute conjunctivitis, bilateral: Secondary | ICD-10-CM | POA: Insufficient documentation

## 2016-09-12 DIAGNOSIS — E1140 Type 2 diabetes mellitus with diabetic neuropathy, unspecified: Secondary | ICD-10-CM | POA: Insufficient documentation

## 2016-09-12 DIAGNOSIS — I10 Essential (primary) hypertension: Secondary | ICD-10-CM | POA: Insufficient documentation

## 2016-09-12 DIAGNOSIS — Z794 Long term (current) use of insulin: Secondary | ICD-10-CM | POA: Insufficient documentation

## 2016-09-12 DIAGNOSIS — H00015 Hordeolum externum left lower eyelid: Secondary | ICD-10-CM | POA: Insufficient documentation

## 2016-09-12 DIAGNOSIS — F17210 Nicotine dependence, cigarettes, uncomplicated: Secondary | ICD-10-CM | POA: Insufficient documentation

## 2016-09-12 MED ORDER — CEPHALEXIN 500 MG PO CAPS: 500.0000 mg | ORAL_CAPSULE | Freq: Two times a day (BID) | ORAL | 0 refills | Status: AC

## 2016-09-12 MED ORDER — ERYTHROMYCIN 5 MG/GM OP OINT
TOPICAL_OINTMENT | Freq: Once | OPHTHALMIC | Status: AC
  Administered 2016-09-12: 14:00:00 via OPHTHALMIC
  Filled 2016-09-12: qty 3.5

## 2016-09-12 NOTE — ED Triage Notes (Signed)
Pt complaint of worsening eye redness, drainage, and pain post treatment. Onset this past Tuesday; seen here for same.

## 2016-09-12 NOTE — Discharge Instructions (Addendum)
Medications: Erythromycin ointment, Keflex  Treatment: Apply erythromycin ointment 4 times daily to bilateral lower eyelids. Do this for 7 days or until told otherwise by ophthalmology. Take Keflex twice daily for 5 days, or until told otherwise by ophthalmology as well. Use warm compresses to bilateral eyes 3-4 times daily.  Follow-up: Please follow-up with Dr. Edrick Ohing or Dr. Allena KatzPatel, an ophthalmologist, by calling her office today to make an appointment. It is important that you are seen by an ophthalmologist. Please return to emergency department or call Dr. Darrol Pokeing's office immediately if you develop any new or worsening symptoms, such as fevers, pain with moving her eyes, or any other concerning symptoms.

## 2016-09-12 NOTE — ED Provider Notes (Signed)
[facility] DEPT Provider Note   CSN: [id number] Arrival date & time: 09/12/16  1155  By signing my name below, I, Larry Mercer, attest that this documentation has been prepared under the direction and in the presence of non-physician practitioner, Armstead Peaks, PA-C. Electronically Signed: Jeanell Mercer, Scribe. 09/12/2016. 1:00 PM.  History   Chief Complaint Chief Complaint  Patient presents with  . Eye Pain   The history is provided by the patient. No language interpreter was used.   HPI Comments: Larry Mercer is a 41 y.o. male who presents to the Emergency Department complaining of constant moderate right eye redness that started about 6 days ago. He was seen 5 days ago and started on nephazoline eye drops. He came to the ED for unchanged symptoms progression and development of drainage and stye. He was unable to see Ophthalmologist due to the weekend. He reports associated left eye yellow drainage and small amount of bleeding from pustule and irritation sensation. He denies any nasal congestion, sore throat, ear pain, photophobia, cough, abdominal pain, nausea, vomiting, or other complaints.     PCP: Smothers, Andree Elk, NP  Past Medical History:  Diagnosis Date  . Diabetes mellitus   . Diabetic neuropathy ([place])   . Hypertension     Patient Active Problem List   Diagnosis Date Noted  . DM (diabetes mellitus) ([place]) 06/19/2012  . HTN (hypertension) 06/19/2012  . Peripheral neuropathy ([place]) 06/19/2012    Past Surgical History:  Procedure Laterality Date  . WISDOM TOOTH EXTRACTION         Home Medications    Prior to Admission medications   Medication Sig Start Date End Date Taking? Authorizing Provider  amLODipine (NORVASC) 10 MG tablet Take 1 tablet (10 mg total) by mouth daily. 05/01/15   Nicole Pisciotta, PA-C  amoxicillin-clavulanate (AUGMENTIN) 875-125 MG tablet Take 1 tablet by mouth 2 (two) times daily. 06/15/15   Gloriann Loan, PA-C  aspirin 81 MG tablet Take  1 tablet (81 mg total) by mouth daily. 07/23/13   Reyne Dumas, MD  cephALEXin (KEFLEX) 500 MG capsule Take 1 capsule (500 mg total) by mouth 2 (two) times daily. 09/12/16 09/17/16  Ovida Delagarza M Alexianna Nachreiner, PA-C  gabapentin (NEURONTIN) 300 MG capsule TAKE 1 CAPSULE BY MOUTH 2 TIMES DAILY. 05/08/14   Lance Bosch, NP  glucose blood test strip Use as instructed 07/23/13   Reyne Dumas, MD  glucose monitoring kit (FREESTYLE) monitoring kit 1 each by Does not apply route as needed for other. Check CBG 3 times a day 07/23/13   Reyne Dumas, MD  HYDROcodone-acetaminophen (NORCO/VICODIN) 5-325 MG tablet Take 1-2 tablets by mouth every 6 hours as needed for pain and/or cough. 05/01/15   Nicole Pisciotta, PA-C  insulin aspart (NOVOLOG) 100 UNIT/ML injection Inject into the skin 3 (three) times daily before meals.    Historical Provider, MD  Insulin Glargine (TOUJEO SOLOSTAR) 300 UNIT/ML SOPN Inject 15 Units into the skin daily at 10 pm. 03/19/14   Lance Bosch, NP  Insulin Pen Needle (1ST CHOICE PEN NEEDLES) 31G X 8 MM MISC Use new pen needle with each injection-Lantus and Novolog pen. 07/23/13   Reyne Dumas, MD  Lancets MISC For TID CBG testing 06/19/12   Acquanetta Chain, DO  lisinopril (PRINIVIL,ZESTRIL) 10 MG tablet Take 1 tablet (10 mg total) by mouth daily. 07/23/13   Reyne Dumas, MD  loratadine (CLARITIN) 10 MG tablet Take 1 tablet (10 mg total) by mouth daily. 06/15/15   Gloriann Loan, PA-C  metFORMIN (GLUCOPHAGE) 500 MG tablet Take 1 tablet (500 mg total) by mouth 2 (two) times daily with a meal. 12/16/14   Lance Bosch, NP  naphazoline-pheniramine (NAPHCON-A) 0.025-0.3 % ophthalmic solution Place 1 drop into both eyes every 4 (four) hours as needed for irritation. 09/08/16   Orson Aloe, PA  naproxen (NAPROSYN) 500 MG tablet Take 1 tablet (500 mg total) by mouth 2 (two) times daily. 05/04/15   Fredia Sorrow, MD  Vitamin D, Ergocalciferol, (DRISDOL) 50000 UNITS CAPS capsule Take 1 capsule (50,000 Units  total) by mouth every 7 (seven) days. 07/25/13   Reyne Dumas, MD    Family History Family History  Problem Relation Age of Onset  . Diabetes Mother   . Hypertension Mother     Social History Social History  Substance Use Topics  . Smoking status: Current Every Day Smoker    Packs/day: 1.00    Types: Cigarettes  . Smokeless tobacco: Never Used  . Alcohol use Yes     Comment: occasional     Allergies   Patient has no known allergies.   Review of Systems Review of Systems  HENT: Negative for congestion, ear pain and sore throat.   Eyes: Positive for discharge (Bleeding) and redness (Right). Negative for photophobia.  Respiratory: Negative for cough.   Gastrointestinal: Negative for abdominal pain, nausea and vomiting.     Physical Exam Updated Vital Signs BP (!) 144/95 (BP Location: Left Arm)   Pulse 96   Temp 97.7 F (36.5 C) (Oral)   Resp 18   SpO2 100%   Physical Exam  Constitutional: He appears well-developed and well-nourished. No distress.  HENT:  Head: Normocephalic and atraumatic.  Mouth/Throat: Oropharynx is clear and moist. No oropharyngeal exudate.  Eyes: EOM are normal. Pupils are equal, round, and reactive to light. Right eye exhibits exudate. Right eye exhibits no discharge. Left eye exhibits exudate and hordeolum. Left eye exhibits no discharge. Right conjunctiva is injected. Left conjunctiva is injected. No scleral icterus.  Bilateral lower lid edema, erythema, and tenderness No pain with EOMs Visual acuity 20/20 bilateral, 20/13 right eye, 20/20 left See photos  Neck: Normal range of motion. Neck supple. No thyromegaly present.  Cardiovascular: Normal rate, regular rhythm, normal heart sounds and intact distal pulses.  Exam reveals no gallop and no friction rub.   No murmur heard. Pulmonary/Chest: Effort normal and breath sounds normal. No stridor. No respiratory distress. He has no wheezes. He has no rales.  Abdominal: Soft. Bowel sounds are  normal. He exhibits no distension. There is no tenderness. There is no rebound and no guarding.  Musculoskeletal: He exhibits no edema.  Lymphadenopathy:    He has no cervical adenopathy.  Neurological: He is alert. Coordination normal.  Skin: Skin is warm and dry. No rash noted. He is not diaphoretic. No pallor.  Psychiatric: He has a normal mood and affect.  Nursing note and vitals reviewed.          ED Treatments / Results  DIAGNOSTIC STUDIES: Oxygen Saturation is 100% on RA, normal by my interpretation.    COORDINATION OF CARE: 1:04 PM- Pt advised of plan for treatment and pt agrees.  Labs (all labs ordered are listed, but only abnormal results are displayed) Labs Reviewed - No data to display  EKG  EKG Interpretation None       Radiology No results found.  Procedures Procedures (including critical care time)  Medications Ordered in ED Medications  erythromycin ophthalmic ointment ( Both  Eyes Given 09/12/16 1340)     Initial Impression / Assessment and Plan / ED Course  I have reviewed the triage vital signs and the nursing notes.  Pertinent labs & imaging results that were available during my care of the patient were reviewed by me and considered in my medical decision making (see chart for details).     Patient returning for third time with worsening bilateral eye irritation, drainage, itching, swelling, redness. No uptake found on fluorescein 2 days ago and normal Tono-Pen pressures. Visual acuity stable. No vision changes. Development of hordeolum to left lower eyelid and yellow drainage bilaterally. Lower lid edema, erythema, and tenderness bilaterally. Concern for preseptal cellulitis. EOMs intact and without pain, low suspicion for orbital cellulitis. Will initiate erythromycin ointment as well as Keflex. I spoke with Dr. Arlina Robes with ophthalmology who guided the patient's management and will see the patient in clinic in one to 2 days. Return precautions  discussed with the patient. Patient understands and agrees with plan. Patient vitals stable throughout ED course and discharged in satisfactory condition. I also discussed patient's case with Dr. Tomi Bamberger who guided the patient's management and agrees with plan.  Final Clinical Impressions(s) / ED Diagnoses   Final diagnoses:  Acute bacterial conjunctivitis of both eyes  Hordeolum externum of left lower eyelid    New Prescriptions New Prescriptions   CEPHALEXIN (KEFLEX) 500 MG CAPSULE    Take 1 capsule (500 mg total) by mouth 2 (two) times daily.   I personally performed the services described in this documentation, which was scribed in my presence. The recorded information has been reviewed and is accurate.     Frederica Kuster, PA-C 09/12/16 [place], MD 09/13/16 1251
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
## 2021-11-11 NOTE — Progress Notes (Deleted)
No visit

## 2021-11-11 NOTE — Progress Notes (Signed)
error
